# Patient Record
Sex: Male | Born: 1970 | Race: White | Hispanic: No | Smoking: Never smoker
Health system: Southern US, Community
[De-identification: ages and names within clinical notes are randomized; demographics above are authoritative.]

## PROBLEM LIST (undated history)

## (undated) DIAGNOSIS — L988 Other specified disorders of the skin and subcutaneous tissue: Secondary | ICD-10-CM

## (undated) DIAGNOSIS — K5792 Diverticulitis of intestine, part unspecified, without perforation or abscess without bleeding: Secondary | ICD-10-CM

## (undated) DIAGNOSIS — G4730 Sleep apnea, unspecified: Secondary | ICD-10-CM

## (undated) DIAGNOSIS — I2699 Other pulmonary embolism without acute cor pulmonale: Secondary | ICD-10-CM

## (undated) DIAGNOSIS — E669 Obesity, unspecified: Secondary | ICD-10-CM

## (undated) HISTORY — DX: Other specified disorders of the skin and subcutaneous tissue: L98.8

## (undated) HISTORY — DX: Other pulmonary embolism without acute cor pulmonale: I26.99

## (undated) HISTORY — DX: Diverticulitis of intestine, part unspecified, without perforation or abscess without bleeding: K57.92

## (undated) HISTORY — DX: Obesity, unspecified: E66.9

## (undated) HISTORY — PX: COLONOSCOPY: SHX174

## (undated) HISTORY — DX: Sleep apnea, unspecified: G47.30

---

## 2010-08-09 DIAGNOSIS — K5792 Diverticulitis of intestine, part unspecified, without perforation or abscess without bleeding: Secondary | ICD-10-CM

## 2010-08-09 HISTORY — DX: Diverticulitis of intestine, part unspecified, without perforation or abscess without bleeding: K57.92

## 2010-12-08 ENCOUNTER — Emergency Department (HOSPITAL_COMMUNITY): Payer: 59

## 2010-12-08 ENCOUNTER — Inpatient Hospital Stay (HOSPITAL_COMMUNITY)
Admission: EM | Admit: 2010-12-08 | Discharge: 2010-12-29 | DRG: 356 | Disposition: A | Discharge status: Home Health | Payer: 59 | Attending: General Surgery | Admitting: General Surgery

## 2010-12-08 DIAGNOSIS — K560 Paralytic ileus: Secondary | ICD-10-CM | POA: Diagnosis not present

## 2010-12-08 DIAGNOSIS — Z87442 Personal history of urinary calculi: Secondary | ICD-10-CM

## 2010-12-08 DIAGNOSIS — K630 Abscess of intestine: Secondary | ICD-10-CM | POA: Diagnosis present

## 2010-12-08 DIAGNOSIS — K5732 Diverticulitis of large intestine without perforation or abscess without bleeding: Principal | ICD-10-CM | POA: Diagnosis present

## 2010-12-08 DIAGNOSIS — Z6834 Body mass index (BMI) 34.0-34.9, adult: Secondary | ICD-10-CM

## 2010-12-08 DIAGNOSIS — K651 Peritoneal abscess: Secondary | ICD-10-CM | POA: Diagnosis present

## 2010-12-08 DIAGNOSIS — B3789 Other sites of candidiasis: Secondary | ICD-10-CM | POA: Diagnosis present

## 2010-12-08 DIAGNOSIS — R000 Tachycardia, unspecified: Secondary | ICD-10-CM | POA: Diagnosis present

## 2010-12-08 DIAGNOSIS — K658 Other peritonitis: Secondary | ICD-10-CM | POA: Diagnosis present

## 2010-12-08 DIAGNOSIS — G4730 Sleep apnea, unspecified: Secondary | ICD-10-CM | POA: Diagnosis present

## 2010-12-08 LAB — CBC
HCT: 38.2 % — ABNORMAL LOW (ref 39.0–52.0)
Hemoglobin: 13.4 g/dL (ref 13.0–17.0)
MCH: 29.6 pg (ref 26.0–34.0)
MCHC: 35.1 g/dL (ref 30.0–36.0)
MCV: 84.3 fL (ref 78.0–100.0)
Platelets: 433 10*3/uL — ABNORMAL HIGH (ref 150–400)
RBC: 4.53 MIL/uL (ref 4.22–5.81)
RDW: 13.3 % (ref 11.5–15.5)
WBC: 23 10*3/uL — ABNORMAL HIGH (ref 4.0–10.5)

## 2010-12-08 LAB — URINALYSIS, ROUTINE W REFLEX MICROSCOPIC
Glucose, UA: NEGATIVE mg/dL
Nitrite: NEGATIVE
Protein, ur: 100 mg/dL — AB
Specific Gravity, Urine: 1.031 — ABNORMAL HIGH (ref 1.005–1.030)
Urobilinogen, UA: 1 mg/dL (ref 0.0–1.0)
pH: 6 (ref 5.0–8.0)

## 2010-12-08 LAB — DIFFERENTIAL
Basophils Absolute: 0 10*3/uL (ref 0.0–0.1)
Basophils Relative: 0 % (ref 0–1)
Eosinophils Absolute: 0 10*3/uL (ref 0.0–0.7)
Eosinophils Relative: 0 % (ref 0–5)
Lymphocytes Relative: 4 % — ABNORMAL LOW (ref 12–46)
Lymphs Abs: 0.9 10*3/uL (ref 0.7–4.0)
Monocytes Absolute: 1.6 10*3/uL — ABNORMAL HIGH (ref 0.1–1.0)
Monocytes Relative: 7 % (ref 3–12)
Neutro Abs: 20.5 10*3/uL — ABNORMAL HIGH (ref 1.7–7.7)
Neutrophils Relative %: 89 % — ABNORMAL HIGH (ref 43–77)

## 2010-12-08 LAB — URINE MICROSCOPIC-ADD ON

## 2010-12-08 MED ORDER — IOHEXOL 300 MG/ML  SOLN: 100.0000 mL | Freq: Once | INTRAMUSCULAR | Status: AC | PRN

## 2010-12-08 MED ORDER — IOHEXOL 300 MG/ML  SOLN
100.0000 mL | Freq: Once | INTRAMUSCULAR | Status: AC | PRN
  Administered 2010-12-08: 100 mL via INTRAVENOUS

## 2010-12-09 LAB — COMPREHENSIVE METABOLIC PANEL
ALT: 50 U/L (ref 0–53)
AST: 24 U/L (ref 0–37)
Albumin: 2.6 g/dL — ABNORMAL LOW (ref 3.5–5.2)
Alkaline Phosphatase: 97 U/L (ref 39–117)
BUN: 9 mg/dL (ref 6–23)
CO2: 25 mEq/L (ref 19–32)
Calcium: 8.8 mg/dL (ref 8.4–10.5)
Chloride: 91 mEq/L — ABNORMAL LOW (ref 96–112)
Creatinine, Ser: 1.02 mg/dL (ref 0.4–1.5)
GFR calc Af Amer: >60 mL/min (ref >60)
GFR calc non Af Amer: >60 mL/min (ref >60)
Glucose, Bld: 108 mg/dL — ABNORMAL HIGH (ref 70–99)
Potassium: 4.5 mEq/L (ref 3.5–5.1)
Sodium: 129 mEq/L — ABNORMAL LOW (ref 135–145)
Total Bilirubin: 0.7 mg/dL (ref 0.3–1.2)
Total Protein: 6.5 g/dL (ref 6.0–8.3)

## 2010-12-09 LAB — CBC
HCT: 35.5 % — ABNORMAL LOW (ref 39.0–52.0)
Hemoglobin: 11.9 g/dL — ABNORMAL LOW (ref 13.0–17.0)
MCH: 29.1 pg (ref 26.0–34.0)
MCHC: 33.5 g/dL (ref 30.0–36.0)
MCV: 86.8 fL (ref 78.0–100.0)
Platelets: 465 10*3/uL — ABNORMAL HIGH (ref 150–400)
RBC: 4.09 MIL/uL — ABNORMAL LOW (ref 4.22–5.81)
RDW: 14.2 % (ref 11.5–15.5)
WBC: 22 10*3/uL — ABNORMAL HIGH (ref 4.0–10.5)

## 2010-12-09 LAB — CREATININE, SERUM
Creatinine, Ser: 0.96 mg/dL (ref 0.4–1.5)
GFR calc Af Amer: >60 mL/min (ref >60)
GFR calc non Af Amer: >60 mL/min (ref >60)

## 2010-12-09 LAB — HEMOGLOBIN A1C
Hgb A1c MFr Bld: 5.8 % — ABNORMAL HIGH (ref <5.7)
Mean Plasma Glucose: 120 mg/dL — ABNORMAL HIGH (ref <117)

## 2010-12-09 LAB — POTASSIUM: Potassium: 4.4 mEq/L (ref 3.5–5.1)

## 2010-12-09 LAB — GLUCOSE, CAPILLARY: Glucose-Capillary: 127 mg/dL — ABNORMAL HIGH (ref 70–99)

## 2010-12-10 HISTORY — PX: LAPAROSCOPY ABDOMEN DIAGNOSTIC: PRO50

## 2010-12-10 LAB — BASIC METABOLIC PANEL
BUN: 16 mg/dL (ref 6–23)
CO2: 26 mEq/L (ref 19–32)
Calcium: 9.4 mg/dL (ref 8.4–10.5)
Chloride: 93 mEq/L — ABNORMAL LOW (ref 96–112)
Creatinine, Ser: 0.93 mg/dL (ref 0.4–1.5)
GFR calc Af Amer: >60 mL/min (ref >60)
GFR calc non Af Amer: >60 mL/min (ref >60)
Glucose, Bld: 93 mg/dL (ref 70–99)
Potassium: 3.9 mEq/L (ref 3.5–5.1)
Sodium: 130 mEq/L — ABNORMAL LOW (ref 135–145)

## 2010-12-10 LAB — SURGICAL PCR SCREEN
MRSA, PCR: NEGATIVE
Staphylococcus aureus: NEGATIVE

## 2010-12-10 LAB — CBC
HCT: 35.6 % — ABNORMAL LOW (ref 39.0–52.0)
Hemoglobin: 11.9 g/dL — ABNORMAL LOW (ref 13.0–17.0)
MCH: 29.1 pg (ref 26.0–34.0)
MCHC: 33.4 g/dL (ref 30.0–36.0)
MCV: 87 fL (ref 78.0–100.0)
Platelets: 462 10*3/uL — ABNORMAL HIGH (ref 150–400)
RBC: 4.09 MIL/uL — ABNORMAL LOW (ref 4.22–5.81)
RDW: 14.3 % (ref 11.5–15.5)
WBC: 25.2 10*3/uL — ABNORMAL HIGH (ref 4.0–10.5)

## 2010-12-11 LAB — CREATININE, SERUM
Creatinine, Ser: 0.85 mg/dL (ref 0.4–1.5)
GFR calc Af Amer: >60 mL/min (ref >60)
GFR calc non Af Amer: >60 mL/min (ref >60)

## 2010-12-11 LAB — POCT I-STAT, CHEM 8
BUN: 7 mg/dL (ref 6–23)
Calcium, Ion: 1.06 mmol/L — ABNORMAL LOW (ref 1.12–1.32)
Chloride: 94 mEq/L — ABNORMAL LOW (ref 96–112)
Creatinine, Ser: 1.2 mg/dL (ref 0.4–1.5)
Glucose, Bld: 127 mg/dL — ABNORMAL HIGH (ref 70–99)
HCT: 45 % (ref 39.0–52.0)
Hemoglobin: 15.3 g/dL (ref 13.0–17.0)
Potassium: 3.4 mEq/L — ABNORMAL LOW (ref 3.5–5.1)
Sodium: 129 mEq/L — ABNORMAL LOW (ref 135–145)
TCO2: 24 mmol/L (ref 0–100)

## 2010-12-11 LAB — CBC
HCT: 33.9 % — ABNORMAL LOW (ref 39.0–52.0)
Hemoglobin: 11 g/dL — ABNORMAL LOW (ref 13.0–17.0)
MCH: 28.6 pg (ref 26.0–34.0)
MCHC: 32.4 g/dL (ref 30.0–36.0)
MCV: 88.3 fL (ref 78.0–100.0)
Platelets: 499 10*3/uL — ABNORMAL HIGH (ref 150–400)
RBC: 3.84 MIL/uL — ABNORMAL LOW (ref 4.22–5.81)
RDW: 14.9 % (ref 11.5–15.5)
WBC: 19.3 10*3/uL — ABNORMAL HIGH (ref 4.0–10.5)

## 2010-12-11 LAB — POTASSIUM: Potassium: 4.3 mEq/L (ref 3.5–5.1)

## 2010-12-12 LAB — CBC
HCT: 34.1 % — ABNORMAL LOW (ref 39.0–52.0)
Hemoglobin: 11.1 g/dL — ABNORMAL LOW (ref 13.0–17.0)
MCH: 28.8 pg (ref 26.0–34.0)
MCHC: 32.6 g/dL (ref 30.0–36.0)
MCV: 88.6 fL (ref 78.0–100.0)
Platelets: 584 K/uL — ABNORMAL HIGH (ref 150–400)
RBC: 3.85 MIL/uL — ABNORMAL LOW (ref 4.22–5.81)
RDW: 14.9 % (ref 11.5–15.5)
WBC: 19 K/uL — ABNORMAL HIGH (ref 4.0–10.5)

## 2010-12-12 LAB — BASIC METABOLIC PANEL WITH GFR
BUN: 12 mg/dL (ref 6–23)
CO2: 28 meq/L (ref 19–32)
Calcium: 8.4 mg/dL (ref 8.4–10.5)
Chloride: 99 meq/L (ref 96–112)
Creatinine, Ser: 0.66 mg/dL (ref 0.4–1.5)
GFR calc non Af Amer: >60 mL/min
Glucose, Bld: 81 mg/dL (ref 70–99)
Potassium: 3.5 meq/L (ref 3.5–5.1)
Sodium: 138 meq/L (ref 135–145)

## 2010-12-13 LAB — CBC
HCT: 32.5 % — ABNORMAL LOW (ref 39.0–52.0)
Hemoglobin: 10.6 g/dL — ABNORMAL LOW (ref 13.0–17.0)
MCH: 28.8 pg (ref 26.0–34.0)
MCHC: 32.6 g/dL (ref 30.0–36.0)
MCV: 88.3 fL (ref 78.0–100.0)
Platelets: 606 10*3/uL — ABNORMAL HIGH (ref 150–400)
RBC: 3.68 MIL/uL — ABNORMAL LOW (ref 4.22–5.81)
RDW: 14.9 % (ref 11.5–15.5)
WBC: 20 10*3/uL — ABNORMAL HIGH (ref 4.0–10.5)

## 2010-12-13 LAB — CULTURE, ROUTINE-ABSCESS

## 2010-12-13 LAB — CREATININE, SERUM
Creatinine, Ser: 0.91 mg/dL (ref 0.4–1.5)
GFR calc Af Amer: >60 mL/min (ref >60)
GFR calc non Af Amer: >60 mL/min (ref >60)

## 2010-12-13 LAB — POTASSIUM: Potassium: 4 mEq/L (ref 3.5–5.1)

## 2010-12-14 LAB — CBC
HCT: 34.1 % — ABNORMAL LOW (ref 39.0–52.0)
Hemoglobin: 11.2 g/dL — ABNORMAL LOW (ref 13.0–17.0)
MCH: 28.7 pg (ref 26.0–34.0)
MCHC: 32.8 g/dL (ref 30.0–36.0)
MCV: 87.4 fL (ref 78.0–100.0)
Platelets: 646 10*3/uL — ABNORMAL HIGH (ref 150–400)
RBC: 3.9 MIL/uL — ABNORMAL LOW (ref 4.22–5.81)
RDW: 14.8 % (ref 11.5–15.5)
WBC: 22.8 10*3/uL — ABNORMAL HIGH (ref 4.0–10.5)

## 2010-12-14 LAB — CREATININE, SERUM
Creatinine, Ser: 0.97 mg/dL (ref 0.4–1.5)
GFR calc Af Amer: >60 mL/min (ref >60)
GFR calc non Af Amer: >60 mL/min (ref >60)

## 2010-12-14 LAB — POTASSIUM: Potassium: 3.6 mEq/L (ref 3.5–5.1)

## 2010-12-15 LAB — BASIC METABOLIC PANEL
BUN: 6 mg/dL (ref 6–23)
CO2: 30 mEq/L (ref 19–32)
Calcium: 8.6 mg/dL (ref 8.4–10.5)
Chloride: 99 mEq/L (ref 96–112)
Creatinine, Ser: 1.13 mg/dL (ref 0.4–1.5)
GFR calc Af Amer: >60 mL/min (ref >60)
GFR calc non Af Amer: >60 mL/min (ref >60)
Glucose, Bld: 120 mg/dL — ABNORMAL HIGH (ref 70–99)
Potassium: 4.2 mEq/L (ref 3.5–5.1)
Sodium: 135 mEq/L (ref 135–145)

## 2010-12-15 LAB — CBC
HCT: 34.6 % — ABNORMAL LOW (ref 39.0–52.0)
Hemoglobin: 11.3 g/dL — ABNORMAL LOW (ref 13.0–17.0)
MCH: 28.7 pg (ref 26.0–34.0)
MCHC: 32.7 g/dL (ref 30.0–36.0)
MCV: 87.8 fL (ref 78.0–100.0)
Platelets: 611 10*3/uL — ABNORMAL HIGH (ref 150–400)
RBC: 3.94 MIL/uL — ABNORMAL LOW (ref 4.22–5.81)
RDW: 14.8 % (ref 11.5–15.5)
WBC: 21.3 10*3/uL — ABNORMAL HIGH (ref 4.0–10.5)

## 2010-12-15 LAB — ANAEROBIC CULTURE

## 2010-12-16 LAB — DIFFERENTIAL
Basophils Absolute: 0.1 10*3/uL (ref 0.0–0.1)
Basophils Relative: 0 % (ref 0–1)
Eosinophils Absolute: 0.3 10*3/uL (ref 0.0–0.7)
Eosinophils Relative: 2 % (ref 0–5)
Lymphocytes Relative: 12 % (ref 12–46)
Lymphs Abs: 2.1 10*3/uL (ref 0.7–4.0)
Monocytes Absolute: 1.3 10*3/uL — ABNORMAL HIGH (ref 0.1–1.0)
Monocytes Relative: 7 % (ref 3–12)
Neutro Abs: 14.1 10*3/uL — ABNORMAL HIGH (ref 1.7–7.7)
Neutrophils Relative %: 79 % — ABNORMAL HIGH (ref 43–77)

## 2010-12-16 LAB — CBC
HCT: 33.5 % — ABNORMAL LOW (ref 39.0–52.0)
Hemoglobin: 11 g/dL — ABNORMAL LOW (ref 13.0–17.0)
MCH: 28.9 pg (ref 26.0–34.0)
MCHC: 32.8 g/dL (ref 30.0–36.0)
MCV: 87.9 fL (ref 78.0–100.0)
Platelets: 648 10*3/uL — ABNORMAL HIGH (ref 150–400)
RBC: 3.81 MIL/uL — ABNORMAL LOW (ref 4.22–5.81)
RDW: 15 % (ref 11.5–15.5)
WBC: 17.9 10*3/uL — ABNORMAL HIGH (ref 4.0–10.5)

## 2010-12-17 LAB — CBC
HCT: 35.4 % — ABNORMAL LOW (ref 39.0–52.0)
Hemoglobin: 11.6 g/dL — ABNORMAL LOW (ref 13.0–17.0)
MCH: 29 pg (ref 26.0–34.0)
MCHC: 32.8 g/dL (ref 30.0–36.0)
MCV: 88.5 fL (ref 78.0–100.0)
Platelets: 694 10*3/uL — ABNORMAL HIGH (ref 150–400)
RBC: 4 MIL/uL — ABNORMAL LOW (ref 4.22–5.81)
RDW: 14.8 % (ref 11.5–15.5)
WBC: 24.2 10*3/uL — ABNORMAL HIGH (ref 4.0–10.5)

## 2010-12-18 ENCOUNTER — Inpatient Hospital Stay (HOSPITAL_COMMUNITY): Payer: 59

## 2010-12-18 LAB — BASIC METABOLIC PANEL
BUN: 11 mg/dL (ref 6–23)
CO2: 27 mEq/L (ref 19–32)
Calcium: 8.9 mg/dL (ref 8.4–10.5)
Chloride: 100 mEq/L (ref 96–112)
Creatinine, Ser: 1.13 mg/dL (ref 0.4–1.5)
GFR calc Af Amer: >60 mL/min (ref >60)
GFR calc non Af Amer: >60 mL/min (ref >60)
Glucose, Bld: 109 mg/dL — ABNORMAL HIGH (ref 70–99)
Potassium: 4.5 mEq/L (ref 3.5–5.1)
Sodium: 135 mEq/L (ref 135–145)

## 2010-12-18 LAB — DIFFERENTIAL
Basophils Absolute: 0.1 10*3/uL (ref 0.0–0.1)
Basophils Relative: 0 % (ref 0–1)
Eosinophils Absolute: 0.3 10*3/uL (ref 0.0–0.7)
Eosinophils Relative: 1 % (ref 0–5)
Lymphocytes Relative: 11 % — ABNORMAL LOW (ref 12–46)
Lymphs Abs: 2.6 10*3/uL (ref 0.7–4.0)
Monocytes Absolute: 1.7 10*3/uL — ABNORMAL HIGH (ref 0.1–1.0)
Monocytes Relative: 7 % (ref 3–12)
Neutro Abs: 18.6 10*3/uL — ABNORMAL HIGH (ref 1.7–7.7)
Neutrophils Relative %: 80 % — ABNORMAL HIGH (ref 43–77)

## 2010-12-18 LAB — CBC
HCT: 35.7 % — ABNORMAL LOW (ref 39.0–52.0)
Hemoglobin: 11.5 g/dL — ABNORMAL LOW (ref 13.0–17.0)
MCH: 28.4 pg (ref 26.0–34.0)
MCHC: 32.2 g/dL (ref 30.0–36.0)
MCV: 88.1 fL (ref 78.0–100.0)
Platelets: 739 10*3/uL — ABNORMAL HIGH (ref 150–400)
RBC: 4.05 MIL/uL — ABNORMAL LOW (ref 4.22–5.81)
RDW: 14.8 % (ref 11.5–15.5)
WBC: 23.1 10*3/uL — ABNORMAL HIGH (ref 4.0–10.5)

## 2010-12-18 MED ORDER — IOHEXOL 300 MG/ML  SOLN
100.0000 mL | Freq: Once | INTRAMUSCULAR | Status: AC | PRN
  Administered 2010-12-18: 100 mL via INTRAVENOUS

## 2010-12-19 LAB — CBC
HCT: 36.9 % — ABNORMAL LOW (ref 39.0–52.0)
Hemoglobin: 11.9 g/dL — ABNORMAL LOW (ref 13.0–17.0)
MCH: 28.5 pg (ref 26.0–34.0)
MCHC: 32.2 g/dL (ref 30.0–36.0)
MCV: 88.5 fL (ref 78.0–100.0)
Platelets: 867 10*3/uL — ABNORMAL HIGH (ref 150–400)
RBC: 4.17 MIL/uL — ABNORMAL LOW (ref 4.22–5.81)
RDW: 14.8 % (ref 11.5–15.5)
WBC: 25.7 10*3/uL — ABNORMAL HIGH (ref 4.0–10.5)

## 2010-12-19 LAB — DIFFERENTIAL
Basophils Absolute: 0 10*3/uL (ref 0.0–0.1)
Basophils Relative: 0 % (ref 0–1)
Eosinophils Absolute: 0.3 10*3/uL (ref 0.0–0.7)
Eosinophils Relative: 1 % (ref 0–5)
Lymphocytes Relative: 10 % — ABNORMAL LOW (ref 12–46)
Lymphs Abs: 2.6 10*3/uL (ref 0.7–4.0)
Monocytes Absolute: 1.8 10*3/uL — ABNORMAL HIGH (ref 0.1–1.0)
Monocytes Relative: 7 % (ref 3–12)
Neutro Abs: 21 10*3/uL — ABNORMAL HIGH (ref 1.7–7.7)
Neutrophils Relative %: 82 % — ABNORMAL HIGH (ref 43–77)

## 2010-12-20 LAB — DIFFERENTIAL
Basophils Absolute: 0 10*3/uL (ref 0.0–0.1)
Basophils Relative: 0 % (ref 0–1)
Eosinophils Absolute: 0.2 10*3/uL (ref 0.0–0.7)
Eosinophils Relative: 1 % (ref 0–5)
Lymphocytes Relative: 12 % (ref 12–46)
Lymphs Abs: 3 10*3/uL (ref 0.7–4.0)
Monocytes Absolute: 2.5 10*3/uL — ABNORMAL HIGH (ref 0.1–1.0)
Monocytes Relative: 10 % (ref 3–12)
Neutro Abs: 18.9 10*3/uL — ABNORMAL HIGH (ref 1.7–7.7)
Neutrophils Relative %: 77 % (ref 43–77)

## 2010-12-20 LAB — CBC
HCT: 37.2 % — ABNORMAL LOW (ref 39.0–52.0)
Hemoglobin: 12.4 g/dL — ABNORMAL LOW (ref 13.0–17.0)
MCH: 29.1 pg (ref 26.0–34.0)
MCHC: 33.3 g/dL (ref 30.0–36.0)
MCV: 87.3 fL (ref 78.0–100.0)
Platelets: 821 10*3/uL — ABNORMAL HIGH (ref 150–400)
RBC: 4.26 MIL/uL (ref 4.22–5.81)
RDW: 14.7 % (ref 11.5–15.5)
WBC: 24.6 10*3/uL — ABNORMAL HIGH (ref 4.0–10.5)

## 2010-12-21 LAB — DIFFERENTIAL
Basophils Absolute: 0.2 10*3/uL — ABNORMAL HIGH (ref 0.0–0.1)
Basophils Relative: 1 % (ref 0–1)
Eosinophils Absolute: 0.2 10*3/uL (ref 0.0–0.7)
Eosinophils Relative: 1 % (ref 0–5)
Lymphocytes Relative: 21 % (ref 12–46)
Lymphs Abs: 4.8 10*3/uL — ABNORMAL HIGH (ref 0.7–4.0)
Monocytes Absolute: 2.3 10*3/uL — ABNORMAL HIGH (ref 0.1–1.0)
Monocytes Relative: 10 % (ref 3–12)
Neutro Abs: 15.2 10*3/uL — ABNORMAL HIGH (ref 1.7–7.7)
Neutrophils Relative %: 67 % (ref 43–77)

## 2010-12-21 LAB — PATHOLOGIST SMEAR REVIEW

## 2010-12-21 LAB — CBC
HCT: 34.9 % — ABNORMAL LOW (ref 39.0–52.0)
Hemoglobin: 11.3 g/dL — ABNORMAL LOW (ref 13.0–17.0)
MCH: 28.5 pg (ref 26.0–34.0)
MCHC: 32.4 g/dL (ref 30.0–36.0)
MCV: 87.9 fL (ref 78.0–100.0)
Platelets: 936 10*3/uL (ref 150–400)
RBC: 3.97 MIL/uL — ABNORMAL LOW (ref 4.22–5.81)
RDW: 14.3 % (ref 11.5–15.5)
WBC: 22.7 10*3/uL — ABNORMAL HIGH (ref 4.0–10.5)

## 2010-12-22 LAB — CBC
HCT: 34.3 % — ABNORMAL LOW (ref 39.0–52.0)
Hemoglobin: 11 g/dL — ABNORMAL LOW (ref 13.0–17.0)
MCH: 28.1 pg (ref 26.0–34.0)
MCHC: 32.1 g/dL (ref 30.0–36.0)
MCV: 87.7 fL (ref 78.0–100.0)
Platelets: 1009 10*3/uL (ref 150–400)
RBC: 3.91 MIL/uL — ABNORMAL LOW (ref 4.22–5.81)
RDW: 14.3 % (ref 11.5–15.5)
WBC: 19 10*3/uL — ABNORMAL HIGH (ref 4.0–10.5)

## 2010-12-22 NOTE — Op Note (Signed)
NAMEATOM, SOLIVAN NO.:  [id number]  MEDICAL RECORD NO.:  [id number]           PATIENT TYPE:  I  LOCATION:  1403                         FACILITY:  [facility]  PHYSICIAN:  Ardeth Sportsman, MD     DATE OF BIRTH:  [birth year]  DATE OF PROCEDURE:  12/10/2010 DATE OF DISCHARGE:                              OPERATIVE REPORT   PRIMARY CARE PHYSICIAN:  Unavailable.  SURGEON:  Ardeth Sportsman, MD  ASSISTANT:  RN.  PREOPERATIVE DIAGNOSIS:  Perforated diverticulitis with failure of nonoperative management.  POSTOPERATIVE DIAGNOSES: 1. Perforated diverticulitis (Hinchey III class). 2. Small bowel interloop abscess. 3. Pelvic abscess.  PROCEDURE PERFORMED: 1. Diagnostic laparoscopy. 2. Laparoscopic drainage of interloop small bowel abscesses. 3. Drainage of pelvic abscess. 4. Abdominal washout with placement of pelvic drain.  ANESTHESIA: 1. General anesthesia. 2. Local anesthetic and a field block on all port sites.  SPECIMENS:  Pelvic abscess and aspiration for culture.  DRAINS:  A 19-French Blake drain goes from right lower quadrant towards the left pericolic gutter down into the pelvis.  ESTIMATED BLOOD LOSS:  Minimal.  COMPLICATIONS:  None.  INDICATION:  Mr. Rewerts is a 40 year old morbidly obese male, otherwise healthy, who developed abdominal pain 6 days ago.  He had increasing fevers and discomfort, and was admitted 36 hours ago with diagnosis of perforated diverticulitis.  He had some free fluid in the pelvis and air, but no specific loculated collections.  We admitted him and placed on IV antibiotics.  His white count decreased.  He defervesced.  His tachycardia improved with resuscitation.  However, his white count has increased today and he is having some low-grade fevers.  His abdominal pain is slightly worse, if not unchanged.  Based on the worsening white count and lack of improvement and lack of an obvious drainable collection  percutaneously, I recommended diagnostic laparoscopy, possibility of a hand assist or open expiration and possibility of sigmoid colectomy with ostomy was discussed as well.  The risks, benefits, and alternatives discussed.  Questions answered.  He and his sister, and his wife agreed to proceed.  OPERATIVE FINDINGS:  He had a moderate peritonitis infraumbilically.  He had some interloop loculations and phlegmon in the small bowel.  His sigmoid was rather inflamed, but I could not find a specific focal perforation.  In the pelvis, he had an abscess running along his entire rectum and the right side of the cul-de-sac.  I have called a Hinchey III class.  No feculent contamination.  DESCRIPTION OF PROCEDURE:  Informed consent was confirmed.  The patient was already on IV ertapenem or Invanz.  He underwent general anesthesia without any difficulty.  He had sequential compression devices during the entire case.  He was positioned in the low lithotomy with arms tucked.  Nasogastric tube was placed.  His abdomen and peritoneum were prepped and draped in sterile fashion.  A surgical time-out confirmed our plan.  I placed a #5-mm port in the right upper quadrant using optical entry technique with the patient steep Trendelenburg and right side up. Camera entry was clean.  Under direct visualization, I placed  a 5-mm port in the right lower quadrant in the epigastric and later in the left mid abdomen as well.  Inspection noted obvious inflammation and infraumbilical peritonitis. He pealed off his greater omentum off the small bowel and sigmoid colon, and reduced that up into left upper quadrant.  His small bowel was moderately dilated.  Around the small bowel, I broke up numerous central loop phlegmon and abscess pockets and eventually reduced all that off with a sigmoid colon sweep.  As I was freeing the colon, I got on the pelvis and released an obvious abscess cavity.  Because of that  purulent pocket, I went ahead and aspirated and sent some fluid off for culture using a closed suction trap system.  I did copious irrigation about 3 L.  I inspected bowel and saw no serosal difficulty and his sigmoid colon I evaluated and I could not find an obvious focal perforation of the definite inflammation and phlegmon.  I did another irrigation with another 3 L and reinspected and again could not find a focal perforation.  Therefore, I irrigated another final 3 L through the entire abdominal cavity and peeled off obvious phlegmon of the pelvis and in the anterior abdominal wall.  Because I could not find any definite focal perforation and I did not see feculent contamination, I felt it would be reasonable to give him a chance to go down with antibiotics and drain.  Therefore, I placed the drain as noted above.  I brought the greater omentum back down and allowed it to lay on top of this sigmoid anteriorly.  Of note, I could see the sigmoid mesentery and I could not see any loculation or pneumatosis or perforation implying a mesentery perforation.  The greater omentum then laid over the entire sigmoid colon and down into the pelvis anteriorly on the rectum quiet well.  I felt that functioned as somewhat of a patch.  I tried to keep small bowel away to avoid any possible fistulous formation.  There was clear return after the end of the irrigation, so I stopped at 9 L.  I did inspection and showed no injury.  Hemostasis was excellent.  I evacuated carbon dioxide through the ports.  I secured the drain using a nylon stitch.  Skin was closed with Monocryl.  Sterile dressing was applied.  We positioned him head up and evacuated another 500 mL out of the pelvis and there was clear return.  He was extubated and sent to the recovery room in the stable condition.  I discussed postoperative findings with the patient's family.  Of note, we will follow him closely if he has further  deterioration and he may require a segmental resection, but I am hopeful that we can calm this down and treat this nonoperatively for that way he can have a delayed endoscopy workup and possible elective resection at a later time given the fact that he is only 40.     Ardeth Sportsman, MD     SCG/MEDQ  D:  12/10/2010  T:  12/11/2010  Job:  [id number]  Electronically Signed by Karie Soda MD on 12/22/2010 12:26:55 PM

## 2010-12-22 NOTE — H&P (Signed)
NAMEJEFFERSON, Shawn Mosley NO.:  [id number]  MEDICAL RECORD NO.:  [id number]           PATIENT TYPE:  I  LOCATION:  1403                         FACILITY:  [facility]  PHYSICIAN:  Shawn Sportsman, MD     DATE OF BIRTH:  [birth year]  DATE OF ADMISSION:  12/08/2010 DATE OF DISCHARGE:                             HISTORY & PHYSICAL   REQUESTING PHYSICIAN:  Shawn Nielsen, MD, [facility].  PRIMARY CARE PHYSICIAN:  Not available.  SURGEON:  Shawn Sportsman, MD.  [facility].  REASON FOR EVALUATION:  Perforated diverticulitis.  HISTORY OF PRESENT ILLNESS:  Shawn Mosley is a 40 year old morbidly obese male, who has never had any health issues aside from kidney stones.  He has had a few mild attacks in the past, but he says these have resolved. He normally has a bowel movement about 2 or 3 times a day.  Five days ago, he did notice some intermittent abdominal pain.  It is primarily lower  and to the left.  It became a little more frequent.  No sick contacts or travel history.  No constipation or diarrhea.  His appetite was normal initially, but began to become weaker.  He started noticing some fevers and his sister noted temperature as high as 100 and 101.  Because the pain became persistent, his sister recommended he come in.  He normally can walk several miles as an activity.  He has no personal or family history of inflammatory bowel disease, Crohn, ulcerative colitis, irritable bowel syndrome.  He does have some mild heartburn, reflux to pepper and onions, but he avoids those foods, never takes any Tums or Rolaids.  No nausea or vomiting.  No hematochezia or melena.  No hematemesis.  PAST MEDICAL HISTORY:  Kidney stones, no recent attacks.  PAST SURGICAL HISTORY:  Negative.  MEDICATIONS:  Tylenol p.r.n.  ALLERGIES:  None known.  SOCIAL HISTORY:  No tobacco or drug use.  He occasionally has few drinks of alcohol.  He does stay with  his sister.  He is not married.  REVIEW OF SYSTEMS:  As noted per HPI.  GENERAL:  He has had some fevers and chills, question of night sweats.  His weight otherwise has been stable.  Eyes, ENT, cardiac negative, respiratory are otherwise negative.  GI:  As noted above.  No dysphagia.  GU:  No hematuria, pyuria, or dysuria.  He had  a little bit discomfort with urination, but that seems to have calmed down.  PHYSICAL EXAMINATION:  GENERAL:  He noted his pain actually backed off a little bit, but because of the fevers, these were concerning him. HEART:  Regular rate and rhythm.  It is tachycardiac.  I have counted a pulse at 110, it was as high as 130 when he came in.  He has gotten 1500 cc of crystalloid in. VASCULAR:  Normal radial and dorsalis pedis pulses.  No carotid bruits. ABDOMEN:  Obese, but soft.  He has some mild discomfort to palpation in his left lower quadrant, but only to deep palpation with no guarding. No real rebound tenderness.  No reproduction.  He has some mild discomfort to cough __________, but is certainly not severe and is less in right lower quadrant and suprapubically.  No umbilical hernia. GENITAL:  Normal external male genitalia.  Testes descended bilaterally. No inguinal hernias. RECTAL:  Deferred per the patient's request. SKIN:  No petechiae.  No purpura.  No sores or lesions. NEUROLOGIC:  Cranial nerves II through XII are intact.  Hand grip is 5/5 equal and symmetrical.  No resting or intention tremors. LYMPH:  No head, neck, axillary, or groin lymphadenopathy. PSYCH:  Pleasant, interactive.  No evidence of dementia, delirium, psychosis, or paranoia.  LABORATORY STUDIES:  He has a white count of 23,000.  Urinalysis:  Just some elevated leukocyte esterase.  He has a KUB that shows no bowel obstruction or free air.  He has a CAT scan that shows his sigmoid colon with significant inflammatory changes and moderate amount of dots of free air in his pelvis  tracking up his left gonadal vessels and a few dots area anterior to his liver as well. He does have some fluid run along the right lateral sigmoid in a fluid pocket that is not really loculated down the pelvis.  He has obvious diverticulosis.  There were no hernias or bowel obstruction.  No obvious masses or tumors.  ASSESSMENT AND PLAN:  A 40 year old morbidly obese male, otherwise healthy with perforated diverticulitis, with some tachycardia, improved with hydration, but clinically not as severe as his CAT scan is. 1. Admit. 2. IV fluids. 3. IV antibiotics. 4. Check hemoglobin A1c and electrolytes, make sure he does not have     underlying diabetes that is masking his  symptoms. 5. Telemetry. 6. Follow closely.  Because his tachycardia is improved and he looks calm and relaxed, and not severely uncomfortable, we will follow him closely and see if we can manage this nonoperatively.  If his pain markedly decreases, his  white count improves, he defervesces, and his tachycardia resolves, then I will do a followup CAT scan in the few days to see if he has a loculated fluid collection that would warrant a drain.  If he has persistent symptoms or worsening symptoms, then he may require exploration in the OR.  The anatomy and physiology of the digestive tract was explained.  The  pathophysiology of  diverticulitis with his different diagnosis discussed.  Discussion of need for diagnostic laparoscopy versus laparotomy with possible resection and colostomy was discussed.  The preference of tying to cool this down with antibiotics and/or drains to delay this to more an elective procedure later on down the line was discussed.  The patient wishes to hold off on surgery if he can, but agrees to being admitted.  His sister agrees as well.  Again , we will follow closely to see  how he feels.  I have a feeling that he has been perforated for several days and therefore, he has declared himself  from going into severe shock, but with the tachycardia guarded against, I want to watch him pretty closely.     Shawn Sportsman, MD     SCG/MEDQ  D:  12/08/2010  T:  12/09/2010  Job:  [id number]  Electronically Signed by Karie Soda MD on 12/22/2010 12:26:07 PM

## 2010-12-23 LAB — CBC
HCT: 34.9 % — ABNORMAL LOW (ref 39.0–52.0)
Hemoglobin: 11.3 g/dL — ABNORMAL LOW (ref 13.0–17.0)
MCH: 28.3 pg (ref 26.0–34.0)
MCHC: 32.4 g/dL (ref 30.0–36.0)
MCV: 87.3 fL (ref 78.0–100.0)
Platelets: 1055 10*3/uL (ref 150–400)
RBC: 4 MIL/uL — ABNORMAL LOW (ref 4.22–5.81)
RDW: 14.2 % (ref 11.5–15.5)
WBC: 19.5 10*3/uL — ABNORMAL HIGH (ref 4.0–10.5)

## 2010-12-24 ENCOUNTER — Inpatient Hospital Stay (HOSPITAL_COMMUNITY): Payer: 59

## 2010-12-24 ENCOUNTER — Encounter (HOSPITAL_COMMUNITY): Payer: Self-pay | Admitting: Radiology

## 2010-12-24 LAB — DIFFERENTIAL
Basophils Absolute: 0.1 10*3/uL (ref 0.0–0.1)
Basophils Relative: 0 % (ref 0–1)
Eosinophils Absolute: 0.2 10*3/uL (ref 0.0–0.7)
Eosinophils Relative: 1 % (ref 0–5)
Lymphocytes Relative: 13 % (ref 12–46)
Lymphs Abs: 2.7 10*3/uL (ref 0.7–4.0)
Monocytes Absolute: 1.6 10*3/uL — ABNORMAL HIGH (ref 0.1–1.0)
Monocytes Relative: 8 % (ref 3–12)
Neutro Abs: 16.6 10*3/uL — ABNORMAL HIGH (ref 1.7–7.7)
Neutrophils Relative %: 78 % — ABNORMAL HIGH (ref 43–77)

## 2010-12-24 LAB — CBC
HCT: 36.6 % — ABNORMAL LOW (ref 39.0–52.0)
Hemoglobin: 11.8 g/dL — ABNORMAL LOW (ref 13.0–17.0)
MCH: 28.4 pg (ref 26.0–34.0)
MCHC: 32.2 g/dL (ref 30.0–36.0)
MCV: 88 fL (ref 78.0–100.0)
Platelets: 1104 10*3/uL (ref 150–400)
RBC: 4.16 MIL/uL — ABNORMAL LOW (ref 4.22–5.81)
RDW: 14.3 % (ref 11.5–15.5)
WBC: 21.2 10*3/uL — ABNORMAL HIGH (ref 4.0–10.5)

## 2010-12-24 MED ORDER — IOHEXOL 300 MG/ML  SOLN
100.0000 mL | Freq: Once | INTRAMUSCULAR | Status: AC | PRN
  Administered 2010-12-24: 100 mL via INTRAVENOUS

## 2010-12-25 ENCOUNTER — Inpatient Hospital Stay (HOSPITAL_COMMUNITY): Payer: 59

## 2010-12-26 LAB — DIFFERENTIAL
Basophils Absolute: 0.1 10*3/uL (ref 0.0–0.1)
Basophils Relative: 0 % (ref 0–1)
Eosinophils Absolute: 0.3 10*3/uL (ref 0.0–0.7)
Eosinophils Relative: 2 % (ref 0–5)
Lymphocytes Relative: 16 % (ref 12–46)
Lymphs Abs: 2.5 10*3/uL (ref 0.7–4.0)
Monocytes Absolute: 1.9 10*3/uL — ABNORMAL HIGH (ref 0.1–1.0)
Monocytes Relative: 12 % (ref 3–12)
Neutro Abs: 11.1 10*3/uL — ABNORMAL HIGH (ref 1.7–7.7)
Neutrophils Relative %: 70 % (ref 43–77)

## 2010-12-26 LAB — CBC
HCT: 36.3 % — ABNORMAL LOW (ref 39.0–52.0)
Hemoglobin: 11.7 g/dL — ABNORMAL LOW (ref 13.0–17.0)
MCH: 28.4 pg (ref 26.0–34.0)
MCHC: 32.2 g/dL (ref 30.0–36.0)
MCV: 88.1 fL (ref 78.0–100.0)
Platelets: 960 10*3/uL (ref 150–400)
RBC: 4.12 MIL/uL — ABNORMAL LOW (ref 4.22–5.81)
RDW: 14.4 % (ref 11.5–15.5)
WBC: 15.8 10*3/uL — ABNORMAL HIGH (ref 4.0–10.5)

## 2010-12-27 LAB — CBC
HCT: 38 % — ABNORMAL LOW (ref 39.0–52.0)
Hemoglobin: 12.3 g/dL — ABNORMAL LOW (ref 13.0–17.0)
MCH: 28.4 pg (ref 26.0–34.0)
MCHC: 32.4 g/dL (ref 30.0–36.0)
MCV: 87.8 fL (ref 78.0–100.0)
Platelets: 912 10*3/uL (ref 150–400)
RBC: 4.33 MIL/uL (ref 4.22–5.81)
RDW: 14.2 % (ref 11.5–15.5)
WBC: 14 10*3/uL — ABNORMAL HIGH (ref 4.0–10.5)

## 2010-12-27 LAB — DIFFERENTIAL
Basophils Absolute: 0.1 10*3/uL (ref 0.0–0.1)
Basophils Relative: 0 % (ref 0–1)
Eosinophils Absolute: 0.3 10*3/uL (ref 0.0–0.7)
Eosinophils Relative: 2 % (ref 0–5)
Lymphocytes Relative: 19 % (ref 12–46)
Lymphs Abs: 2.7 10*3/uL (ref 0.7–4.0)
Monocytes Absolute: 1.6 10*3/uL — ABNORMAL HIGH (ref 0.1–1.0)
Monocytes Relative: 12 % (ref 3–12)
Neutro Abs: 9.3 10*3/uL — ABNORMAL HIGH (ref 1.7–7.7)
Neutrophils Relative %: 67 % (ref 43–77)

## 2010-12-28 LAB — DIFFERENTIAL
Basophils Absolute: 0.1 10*3/uL (ref 0.0–0.1)
Basophils Relative: 1 % (ref 0–1)
Eosinophils Absolute: 0.4 10*3/uL (ref 0.0–0.7)
Eosinophils Relative: 3 % (ref 0–5)
Lymphocytes Relative: 22 % (ref 12–46)
Lymphs Abs: 2.7 10*3/uL (ref 0.7–4.0)
Monocytes Absolute: 1.5 10*3/uL — ABNORMAL HIGH (ref 0.1–1.0)
Monocytes Relative: 12 % (ref 3–12)
Neutro Abs: 7.9 10*3/uL — ABNORMAL HIGH (ref 1.7–7.7)
Neutrophils Relative %: 63 % (ref 43–77)

## 2010-12-28 LAB — CBC
HCT: 37.8 % — ABNORMAL LOW (ref 39.0–52.0)
Hemoglobin: 12.3 g/dL — ABNORMAL LOW (ref 13.0–17.0)
MCH: 28.7 pg (ref 26.0–34.0)
MCHC: 32.5 g/dL (ref 30.0–36.0)
MCV: 88.1 fL (ref 78.0–100.0)
Platelets: 840 10*3/uL — ABNORMAL HIGH (ref 150–400)
RBC: 4.29 MIL/uL (ref 4.22–5.81)
RDW: 14.2 % (ref 11.5–15.5)
WBC: 12.6 10*3/uL — ABNORMAL HIGH (ref 4.0–10.5)

## 2010-12-29 LAB — BODY FLUID CULTURE

## 2010-12-30 LAB — ANAEROBIC CULTURE

## 2011-01-01 NOTE — Discharge Summary (Signed)
NAMEESTEVEN, OVERFELT NO.:  [id number]  MEDICAL RECORD NO.:  [id number]           PATIENT TYPE:  I  LOCATION:  1403                         FACILITY:  [facility]  PHYSICIAN:  Currie Paris, M.D.DATE OF BIRTH:  03-Sep-1970  DATE OF ADMISSION:  12/08/2010 DATE OF DISCHARGE:  12/29/2010                              DISCHARGE SUMMARY   ADMITTING PHYSICIAN:  Ardeth Sportsman, MD  DISCHARGING PHYSICIAN:  Currie Paris, M.D.  PRIMARY CARE PHYSICIAN:  None.  PROCEDURES: 1. Diagnostic laparoscopy with laparoscopic drainage of small bowel     abscesses, drainage of pelvic abscess and abdominal washout with     placement of pelvic drain by Dr. Karie Soda on Dec 10, 2010. 2. CT-guided abscess drainage by Interventional Radiology on Dec 25, 2010.  CONSULTANTS:  Interventional Radiology.  REASON FOR ADMISSION:  Mr. Morini is a 40 year old male who is otherwise healthy who 5 days prior to admission began having some intermittent abdominal pain that was predominantly in the lower abdomen on the left side.  It began to become more frequent and the patient was noted to have fevers as high as 101 at home.  Because of persistent pain, his sister recommended he come to the emergency room.  Upon arrival here, he had a KUB which revealed no bowel obstruction or free air.  He then had a CT scan which showed sigmoid colon with significant inflammatory changes and moderate amount of dots of free air in the pelvis tracking up his left gonadal vessels and a few dots of air inferior to the liver as well.  He also had some fluids along the right lateral sigmoid pocket that was not really loculated yet.  He did have obvious diverticulosis and all these findings were consistent with perforated diverticulitis. Please see admitting history and physical for further details.  ADMITTING DIAGNOSES: 1. Perforated diverticulitis. 2. Tachycardia.  HOSPITAL COURSE:  At this time,  the patient was admitted.  He was placed on IV antibiotics as well as IV fluids for hydration.  He was initially kept n.p.o. due to the significance of his diverticulitis.  The following day, the patient was rechecked.  His white blood cell count had decreased to 22,000 and his antibiotics were continued.  On hospital day #2, the patient's white blood cell count increased to 25,000.  He was continuing to have abdominal pain.  At this time, it was felt that due to a failure of medical management at this time, a diagnostic laparoscopy with abdominal washout would be pursued.  At this time, this was completed.  He had a drainage of a pelvic and interloop abscess during the procedure.  A pelvic drain was placed following the procedure.  Initially the patient did have an NG tube placed after surgery.  This was continued to await the resolution of postoperative ileus.  This continued until postoperative day #3 when his NG tube was discontinued and he was started on clear liquids.  His white count remained stable around 20,000 at this time.  Over the next several days, the patient seemed to be improving  despite minimal improvement in his white blood cell count.  However, on postoperative day #5, it did appear that the patient's surgical drain did have feculent material present. Despite this, his diet was advanced as tolerated as the patient's abdomen remained quite benign.  As the patient was becoming more stable for possible discharge home, he then began running fevers of 101 and above with an increase in white blood cell count up to 25,000. At this time, the patient was kept and a repeat CT scan was completed. His diet was also backed off to clear liquids.  The CT scan showed an increase in abscess in the pelvis.  At this point, it was felt that we needed to increase the irrigation to drain to see if that would assist with further evacuation of abscess.  Once again over the next several days  his diet was advanced back up as tolerated.  His white blood cell count did begin to trend down.  A repeat CT scan was obtained on Dec 25, 2010, which is about a week status post his last CT scan.  Once this was completed, it did show fluid collection in the pelvis and a question of whether the actual pelvic drain was working or not.  Therefore, Interventional Radiology was consulted and a new pelvic drain was placed.  After this drain was placed, the patient's fairly stable white count around 20,000 began to decrease and eventually several days after the drain was placed was around 12,000.  The patient remained stable, tolerating a regular diet with minimal abdominal tenderness.  His original surgical drain at this point was draining very minimal and was no longer feculent.  The new Interventional Radiology JP drain was draining some cloudy serous output.  By Dec 29, 2010, the patient was felt to be stable for discharge home.  Of note, the new pelvic drain that was placed by Interventional Radiology did have cultures.  These cultures revealed some yeast and therefore the patient in addition to Augmentin and Flagyl was placed on Diflucan.  Home health was arranged for drain care.  DISCHARGE DIAGNOSES: 1. Perforated diverticulitis status post diagnostic laparoscopic     abdominal washout with drainage of pelvic and interloop abscesses. 2. Status post percutaneous abdominal abscess drainage. 3. Leukocytosis which is resolving.  DISCHARGE MEDICATIONS:  Please see medication reconciliation form.  DISCHARGE INSTRUCTIONS:  The patient may increase his activity slowly. He may shower, however, he is not to bathe.  He is to resume a low-fiber diet.  He is to flush his drains with 10 cc of saline 3 times a day.  He will have home health arranged to help with this initially.  He is to follow up with Dr. Michaell Cowing in our office in 1 week.  He is to call for worsening pain or fever greater than  101.5.     Letha Cape, PA   ______________________________ Currie Paris, M.D.    KEO/MEDQ  D:  12/29/2010  T:  12/29/2010  Job:  [id number]  cc:   Ardeth Sportsman, MD [address] [place] [address]  Electronically Signed by Barnetta Chapel PA on 12/31/2010 01:35:14 PM Electronically Signed by Cyndia Bent M.D. on 01/01/2011 11:32:06 AM

## 2011-01-06 ENCOUNTER — Encounter: Payer: Self-pay | Admitting: Gastroenterology

## 2011-01-06 ENCOUNTER — Other Ambulatory Visit (INDEPENDENT_AMBULATORY_CARE_PROVIDER_SITE_OTHER): Payer: Self-pay | Admitting: Surgery

## 2011-01-06 DIAGNOSIS — IMO0001 Reserved for inherently not codable concepts without codable children: Secondary | ICD-10-CM

## 2011-01-08 ENCOUNTER — Encounter: Payer: Self-pay | Admitting: Gastroenterology

## 2011-01-08 ENCOUNTER — Ambulatory Visit (INDEPENDENT_AMBULATORY_CARE_PROVIDER_SITE_OTHER): Payer: 59 | Admitting: Gastroenterology

## 2011-01-08 DIAGNOSIS — K5730 Diverticulosis of large intestine without perforation or abscess without bleeding: Secondary | ICD-10-CM

## 2011-01-08 DIAGNOSIS — R933 Abnormal findings on diagnostic imaging of other parts of digestive tract: Secondary | ICD-10-CM

## 2011-01-08 NOTE — Progress Notes (Signed)
HPI: This is a  very pleasant 40 year old man who was admitted to [facility] early May and spent 3 weeks there with complicated diverticulitis.  He was managed at first on IV antibiotics but eventually required laparoscopy with washout and drain placement. He improved but developed a delayed abscess and had a percutaneous drain placed mid May. That drain remains in place, he has been on oral antibiotics since discharge about 10 days ago. He has had no fevers or chills and no abdominal pain. The drain which exits his right buttocks continues to drain murky fluid. He is scheduled for a drain check, fistulogram one week from now. He is here to consider colonoscopy prior to eventual sigmoid colectomy.   Review of systems: Pertinent positive and negative review of systems were noted in the above HPI section.  All other review of systems was otherwise negative.   Past Medical History, Past Surgical History, Family History, Social History, Current Medications, Allergies were all reviewed with the patient via [facility] electronic medical record system.   Physical Exam: Ht 5\' 9"  (1.753 m)  Wt 204 lb (92.534 kg)  BMI 30.13 kg/m2 Constitutional: generally well-appearing Psychiatric: alert and oriented x3 Eyes: extraocular movements intact Mouth: oral pharynx moist, no lesions Neck: supple no lymphadenopathy Cardiovascular: heart regular rate and rhythm Lungs: clear to auscultation bilaterally Abdomen: soft, nontender, nondistended, no obvious ascites, no peritoneal signs, normal bowel sounds Extremities: no lower extremity edema bilaterally Skin: no lesions on visible extremities    Assessment and plan: 40 y.o. male with recent complicated diverticulitis, still has percutaneous abscess drain in place  He should have colonoscopy prior to his sigmoid colectomy which is currently scheduled for early August. Dr. Michaell Cowing feels and I agree that this should not be done until he at least have a  very good idea that the abscess has healed and the drain is safely pulled.  We will tentatively schedule him for mid July colonoscopy and he will call in 4 weeks to report on his progress.

## 2011-01-08 NOTE — Patient Instructions (Signed)
You will be set up for a colonoscopy in Mid July at [facility], this should give time for your fistula to heal, drain to be removed. Call Dr. Christella Hartigan office in 4 weeks to update on how you are doing, specifically is the drain out and healed over. A copy of this information will be made available to Dr. Michaell Cowing.

## 2011-01-14 ENCOUNTER — Ambulatory Visit (HOSPITAL_COMMUNITY)
Admission: RE | Admit: 2011-01-14 | Discharge: 2011-01-14 | Disposition: A | Discharge status: Home / Self Care | Payer: 59 | Source: Ambulatory Visit | Attending: Surgery | Admitting: Surgery

## 2011-01-14 ENCOUNTER — Other Ambulatory Visit (INDEPENDENT_AMBULATORY_CARE_PROVIDER_SITE_OTHER): Payer: Self-pay | Admitting: Surgery

## 2011-01-14 ENCOUNTER — Other Ambulatory Visit (HOSPITAL_COMMUNITY): Payer: 59

## 2011-01-14 DIAGNOSIS — K5730 Diverticulosis of large intestine without perforation or abscess without bleeding: Secondary | ICD-10-CM | POA: Insufficient documentation

## 2011-01-14 DIAGNOSIS — IMO0001 Reserved for inherently not codable concepts without codable children: Secondary | ICD-10-CM

## 2011-01-14 MED ORDER — IOHEXOL 300 MG/ML  SOLN
35.0000 mL | Freq: Once | INTRAMUSCULAR | Status: AC | PRN
  Administered 2011-01-14: 35 mL

## 2011-02-02 ENCOUNTER — Ambulatory Visit (INDEPENDENT_AMBULATORY_CARE_PROVIDER_SITE_OTHER): Payer: 59 | Admitting: Surgery

## 2011-02-02 ENCOUNTER — Encounter (INDEPENDENT_AMBULATORY_CARE_PROVIDER_SITE_OTHER): Payer: Self-pay | Admitting: Surgery

## 2011-02-02 ENCOUNTER — Other Ambulatory Visit (INDEPENDENT_AMBULATORY_CARE_PROVIDER_SITE_OTHER): Payer: Self-pay | Admitting: Surgery

## 2011-02-02 DIAGNOSIS — K5732 Diverticulitis of large intestine without perforation or abscess without bleeding: Secondary | ICD-10-CM

## 2011-02-02 DIAGNOSIS — K5720 Diverticulitis of large intestine with perforation and abscess without bleeding: Secondary | ICD-10-CM | POA: Insufficient documentation

## 2011-02-02 DIAGNOSIS — K5792 Diverticulitis of intestine, part unspecified, without perforation or abscess without bleeding: Secondary | ICD-10-CM

## 2011-02-02 NOTE — Progress Notes (Signed)
Subjective:     Patient ID: Shawn Mosley, male   DOB: 1971-03-19, 40 y.o.   MRN: [id number]    There were no vitals taken for this visit.    HPI  40 year old male with diverticulitis complicated by abscess & recurrent abscess with Candida .  Fistula status post drain. He is feeling better. He has lost 40 pounds all this happened. He is feeling stressed with the recent horse and he prolonged sickness. He's had 3 bowel movements a day.  They are flushing the drain with 10 mL site twice a day. They're noting that its leaking around the drain and coming back.  The output is slighlty feculent or bilious in the morning but then clears up into the day. He has had not had any attacks. He's been staying on a bland diet.  Review of Systems  Gastrointestinal: Negative for nausea, vomiting, abdominal pain, diarrhea, constipation, blood in stool, abdominal distention, anal bleeding and rectal pain.  Psychiatric/Behavioral: Positive for sleep disturbance and dysphoric mood.       Stressed w divorce & long hospital stay  All other systems reviewed and are negative.       Objective:   Physical Exam  Constitutional: He appears well-developed and well-nourished. No distress.  HENT:  Head: Normocephalic.  Mouth/Throat: Oropharynx is clear and moist.  Cardiovascular: Normal rate and intact distal pulses.   Pulmonary/Chest: Effort normal. No respiratory distress.  Abdominal: Soft. Bowel sounds are normal. He exhibits no distension. There is no tenderness. There is no guarding.  Musculoskeletal: Normal range of motion.  Neurological: He is alert. No cranial nerve deficit.  Skin: Skin is warm and dry. He is not diaphoretic.  Psychiatric:       Tearful but consolable       Assessment:     Diverticulitis in a morbidly obese male complicated by recurrent abscess with chronic fistula, now controlled with antibiotics and drain.    Plan:     Drain study. If a fistula if the tissue is closed &  abscesses gone, then remove the drain. If it persists then prepped and eventually need to remove or readjust it.  Once the drain is out, do colonoscopy, then surgery. I agree with Dr. Christella Hartigan who should wait to the fissures closed scissors were risk of perforation or inflammation.  Left-assisted segmental colonic resection. And possibility open. He's lost weight which should help decrease his risk of infection other problems. Discussed procedure and person benefits. Were doing this way to lead to a one stage procedure. He is very motivated to minimize the chance of ostomy but understands that it may be needed to help her prevent a life-threatening event.  Continue fluconazole as last abscess grew out Candida only. Reserve Augmentin and Flagyl as p.r.n. only. I did give him a prescription and told him to fill that if he feels another attack coming on.  His surgery date is today for early August. We'll keep that for now. It may need to be delayed depending on when the fistula closes. Maybe we proceed with surgery if fistula persists, but wait for least 3 months of persistent fistula to allow inflammation to minimize.

## 2011-02-04 ENCOUNTER — Ambulatory Visit (HOSPITAL_COMMUNITY)
Admission: RE | Admit: 2011-02-04 | Discharge: 2011-02-04 | Disposition: A | Discharge status: Home / Self Care | Payer: 59 | Source: Ambulatory Visit | Attending: Surgery | Admitting: Surgery

## 2011-02-04 DIAGNOSIS — K5720 Diverticulitis of large intestine with perforation and abscess without bleeding: Secondary | ICD-10-CM

## 2011-02-04 DIAGNOSIS — K5732 Diverticulitis of large intestine without perforation or abscess without bleeding: Secondary | ICD-10-CM | POA: Insufficient documentation

## 2011-02-04 MED ORDER — IOHEXOL 300 MG/ML  SOLN
50.0000 mL | Freq: Once | INTRAMUSCULAR | Status: AC | PRN
  Administered 2011-02-04: 26 mL

## 2011-02-09 ENCOUNTER — Telehealth (INDEPENDENT_AMBULATORY_CARE_PROVIDER_SITE_OTHER): Payer: Self-pay

## 2011-02-09 NOTE — Telephone Encounter (Signed)
Returned [employer]. Pt c/o calf pain that started yesterday but is better today. Pt went to see P.A. In [facility]. And she told him to take ibuprofen which has helped the pain. Pt advised to keep watch over area if gets worse to go to ER per Dr Michaell Cowing [facility] 02-09-11

## 2011-02-16 ENCOUNTER — Encounter (INDEPENDENT_AMBULATORY_CARE_PROVIDER_SITE_OTHER): Payer: Self-pay | Admitting: Surgery

## 2011-02-17 ENCOUNTER — Ambulatory Visit (INDEPENDENT_AMBULATORY_CARE_PROVIDER_SITE_OTHER): Payer: 59 | Admitting: Surgery

## 2011-02-17 ENCOUNTER — Encounter (INDEPENDENT_AMBULATORY_CARE_PROVIDER_SITE_OTHER): Payer: Self-pay | Admitting: Surgery

## 2011-02-17 VITALS — BP 122/80 | HR 68 | Temp 96.6°F | Ht 69.0 in | Wt 196.0 lb

## 2011-02-17 DIAGNOSIS — M79669 Pain in unspecified lower leg: Secondary | ICD-10-CM

## 2011-02-17 DIAGNOSIS — K5732 Diverticulitis of large intestine without perforation or abscess without bleeding: Secondary | ICD-10-CM

## 2011-02-17 DIAGNOSIS — M7989 Other specified soft tissue disorders: Secondary | ICD-10-CM

## 2011-02-17 DIAGNOSIS — K5720 Diverticulitis of large intestine with perforation and abscess without bleeding: Secondary | ICD-10-CM

## 2011-02-17 DIAGNOSIS — M79609 Pain in unspecified limb: Secondary | ICD-10-CM

## 2011-02-17 NOTE — Progress Notes (Signed)
Subjective:     Patient ID: Shawn Mosley, male   DOB: 10-12-1970, 40 y.o.   MRN: [id number]    BP 122/80  Pulse 68  Temp(Src) 96.6 F (35.9 C) (Temporal)  Ht 5\' 9"  (1.753 m)  Wt 196 lb (88.905 kg)  BMI 28.94 kg/m2    HPI  Diagnoses:  Diverticulitis with chronic fistula. Right calf pain and swelling.  The patient comes in today feeling okay. No nausea vomiting. He's having a 2 bowel movements a day. No bleeding.No worsening pain. His fevers chills or sweats.  The drainage output is down to 5-10 mL a day.  Tan with occasionally some flecks of pink/red.  Leaks around skin with flushing.  He began to get leg swelling last week. Started out with right calf pain. The swelling has been below the knee. It has gone down. He saw a PA. They recommended followup. He did not have any studies. He called [place], and we recommend starting aspirin & calling if it got worse. He is walking better today. The swelling is down but has not gone away.  Review of Systems  Constitutional: Negative for fever, chills and diaphoresis.  HENT: Negative for nosebleeds, sore throat, facial swelling, mouth sores, trouble swallowing and ear discharge.   Eyes: Negative for photophobia, discharge and visual disturbance.  Respiratory: Negative for choking, chest tightness, shortness of breath and stridor.   Cardiovascular: Negative for chest pain and palpitations.  Gastrointestinal: Negative for nausea, vomiting, abdominal pain, diarrhea, constipation, blood in stool, abdominal distention, anal bleeding and rectal pain.  Genitourinary: Negative for dysuria, urgency, difficulty urinating and testicular pain.  Musculoskeletal: Negative for myalgias, back pain, arthralgias and gait problem.  Skin: Negative for color change, pallor, rash and wound.  Neurological: Negative for dizziness, speech difficulty, weakness, numbness and headaches.  Hematological: Negative for adenopathy. Does not bruise/bleed easily.    Psychiatric/Behavioral: Negative for hallucinations, confusion and agitation.       Objective:   Physical Exam  Constitutional: He is oriented to person, place, and time. He appears well-developed and well-nourished. No distress.  HENT:  Head: Normocephalic.  Mouth/Throat: Oropharynx is clear and moist. No oropharyngeal exudate.  Eyes: Conjunctivae and EOM are normal. Pupils are equal, round, and reactive to light. No scleral icterus.  Neck: Normal range of motion. Neck supple. No tracheal deviation present.  Cardiovascular: Normal rate, regular rhythm and intact distal pulses.   Pulmonary/Chest: Effort normal and breath sounds normal. No respiratory distress.  Abdominal: Soft. He exhibits no distension and no mass. There is no tenderness. There is no rebound. Hernia confirmed negative in the right inguinal area and confirmed negative in the left inguinal area.       Less obese.  RLQ drain with scant tan creamy fluid in drain.  Exit site clean  Musculoskeletal: Normal range of motion. He exhibits edema and tenderness.       Right calf swelling mild.  2+ edema/ankle.  Lymphadenopathy:    He has no cervical adenopathy.       Right: No inguinal adenopathy present.       Left: No inguinal adenopathy present.  Neurological: He is alert and oriented to person, place, and time. No cranial nerve deficit. He exhibits normal muscle tone. Coordination normal.  Skin: Skin is warm and dry. No rash noted. He is not diaphoretic. No erythema. No pallor.  Psychiatric: He has a normal mood and affect. His behavior is normal. Judgment and thought content normal.  Assessment:     Diverticulitis with fistula. A abscess cavity closing down.  Right calf swelling and edema. Rule out DVT.    Plan:     Drains studies last fistulogram later this month. Delayed surgery until fistula closed.  Colonoscopy once this was closed just prior to surgery.  Duplex of bilateral extremities to rule out DVT.  Continue aspirin.  Status primary care physician. He is working on this.  Call after studies are done.  Overall he looks quite well. We do need to do resection at some point. If the fistula remains open more than into the fall then we may have to by the bullet and is to surgery anyway. However but there was a bit longer it would make the likelihood of perioperative ostomy less. He is motivated to wait to avoid that.

## 2011-02-17 NOTE — Patient Instructions (Signed)
Complete fluconazole antibiotic.  Get drain study & ultrasound (duplex) of legs to rule out leg clot.  Call after tests done.  Surgery in August may have to be delayed until the fistula is closed

## 2011-02-18 ENCOUNTER — Emergency Department (HOSPITAL_COMMUNITY)
Admission: EM | Admit: 2011-02-18 | Discharge: 2011-02-18 | Disposition: A | Discharge status: Home / Self Care | Payer: 59 | Attending: Emergency Medicine | Admitting: Emergency Medicine

## 2011-02-18 ENCOUNTER — Ambulatory Visit
Admission: RE | Admit: 2011-02-18 | Discharge: 2011-02-18 | Disposition: A | Discharge status: Home / Self Care | Payer: 59 | Source: Ambulatory Visit | Attending: Surgery | Admitting: Surgery

## 2011-02-18 ENCOUNTER — Other Ambulatory Visit (INDEPENDENT_AMBULATORY_CARE_PROVIDER_SITE_OTHER): Payer: Self-pay | Admitting: Surgery

## 2011-02-18 DIAGNOSIS — M7989 Other specified soft tissue disorders: Secondary | ICD-10-CM

## 2011-02-18 DIAGNOSIS — I82409 Acute embolism and thrombosis of unspecified deep veins of unspecified lower extremity: Secondary | ICD-10-CM | POA: Insufficient documentation

## 2011-02-18 DIAGNOSIS — M79669 Pain in unspecified lower leg: Secondary | ICD-10-CM

## 2011-02-18 LAB — CBC
HCT: 36.5 % — ABNORMAL LOW (ref 39.0–52.0)
Hemoglobin: 11.5 g/dL — ABNORMAL LOW (ref 13.0–17.0)
MCH: 27.8 pg (ref 26.0–34.0)
MCHC: 31.5 g/dL (ref 30.0–36.0)
MCV: 88.4 fL (ref 78.0–100.0)
Platelets: 459 10*3/uL — ABNORMAL HIGH (ref 150–400)
RBC: 4.13 MIL/uL — ABNORMAL LOW (ref 4.22–5.81)
RDW: 14.1 % (ref 11.5–15.5)
WBC: 11.4 10*3/uL — ABNORMAL HIGH (ref 4.0–10.5)

## 2011-02-18 LAB — BASIC METABOLIC PANEL
BUN: 10 mg/dL (ref 6–23)
CO2: 29 mEq/L (ref 19–32)
Calcium: 10.1 mg/dL (ref 8.4–10.5)
Chloride: 100 mEq/L (ref 96–112)
Creatinine, Ser: 0.81 mg/dL (ref 0.50–1.35)
GFR calc Af Amer: >60 mL/min (ref >60)
GFR calc non Af Amer: >60 mL/min (ref >60)
Glucose, Bld: 94 mg/dL (ref 70–99)
Potassium: 3.9 mEq/L (ref 3.5–5.1)
Sodium: 137 mEq/L (ref 135–145)

## 2011-02-18 LAB — PROTIME-INR
INR: 1.03 (ref 0.00–1.49)
Prothrombin Time: 13.7 seconds (ref 11.6–15.2)

## 2011-02-18 LAB — APTT: aPTT: 30 seconds (ref 24–37)

## 2011-03-03 ENCOUNTER — Ambulatory Visit
Admission: RE | Admit: 2011-03-03 | Discharge: 2011-03-03 | Disposition: A | Discharge status: Home / Self Care | Payer: 59 | Source: Ambulatory Visit | Attending: Surgery | Admitting: Surgery

## 2011-03-03 DIAGNOSIS — K5720 Diverticulitis of large intestine with perforation and abscess without bleeding: Secondary | ICD-10-CM

## 2011-03-04 ENCOUNTER — Telehealth (INDEPENDENT_AMBULATORY_CARE_PROVIDER_SITE_OTHER): Payer: Self-pay

## 2011-03-04 DIAGNOSIS — K5792 Diverticulitis of intestine, part unspecified, without perforation or abscess without bleeding: Secondary | ICD-10-CM

## 2011-03-04 NOTE — Telephone Encounter (Signed)
Tammy from [facility] called req, for Dr Michaell Cowing to put another Drain Study in for pt to get next wk.Hulda Humphrey

## 2011-03-10 ENCOUNTER — Ambulatory Visit
Admission: RE | Admit: 2011-03-10 | Discharge: 2011-03-10 | Disposition: A | Discharge status: Home / Self Care | Payer: 59 | Source: Ambulatory Visit | Attending: Surgery | Admitting: Surgery

## 2011-03-10 DIAGNOSIS — K5792 Diverticulitis of intestine, part unspecified, without perforation or abscess without bleeding: Secondary | ICD-10-CM

## 2011-03-16 ENCOUNTER — Inpatient Hospital Stay (HOSPITAL_COMMUNITY): Admission: RE | Admit: 2011-03-16 | Payer: 59 | Source: Ambulatory Visit | Admitting: Surgery

## 2012-01-27 ENCOUNTER — Encounter: Payer: Self-pay | Admitting: Physician Assistant

## 2012-01-27 LAB — PROTIME-INR: INR: 2.4 — AB (ref 0.9–1.1)

## 2012-02-17 ENCOUNTER — Encounter: Payer: Self-pay | Admitting: Physician Assistant

## 2012-08-08 IMAGING — CR DG ABDOMEN 1V
2 series · 2 of 2 positions shown · non-contrast
Comparison: None.

CLINICAL DATA: 40-year-old male abdominal pain.

ABDOMEN - 1 VIEW

[t abdomen supine (1 of 2)]
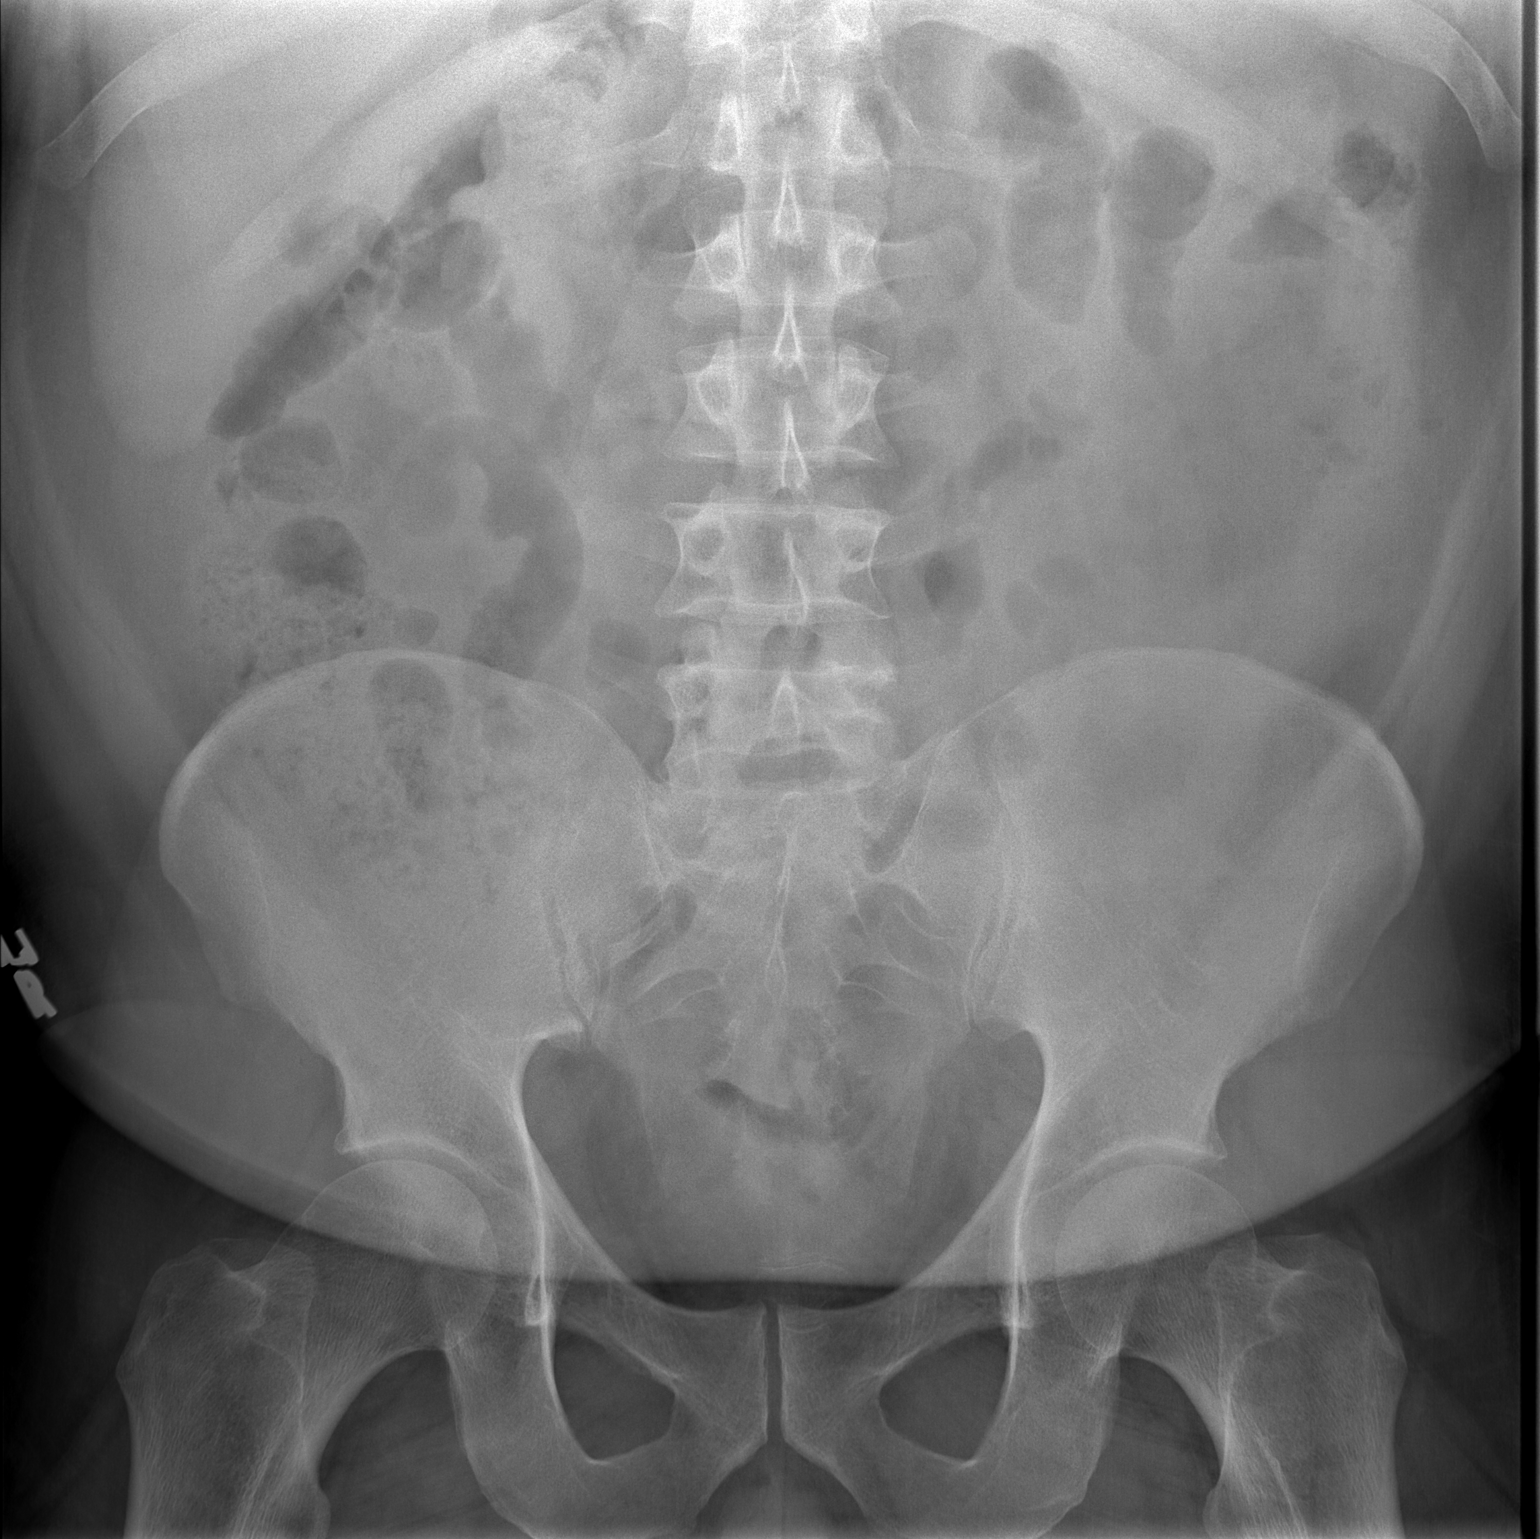

[t abdomen supine (2 of 2)]
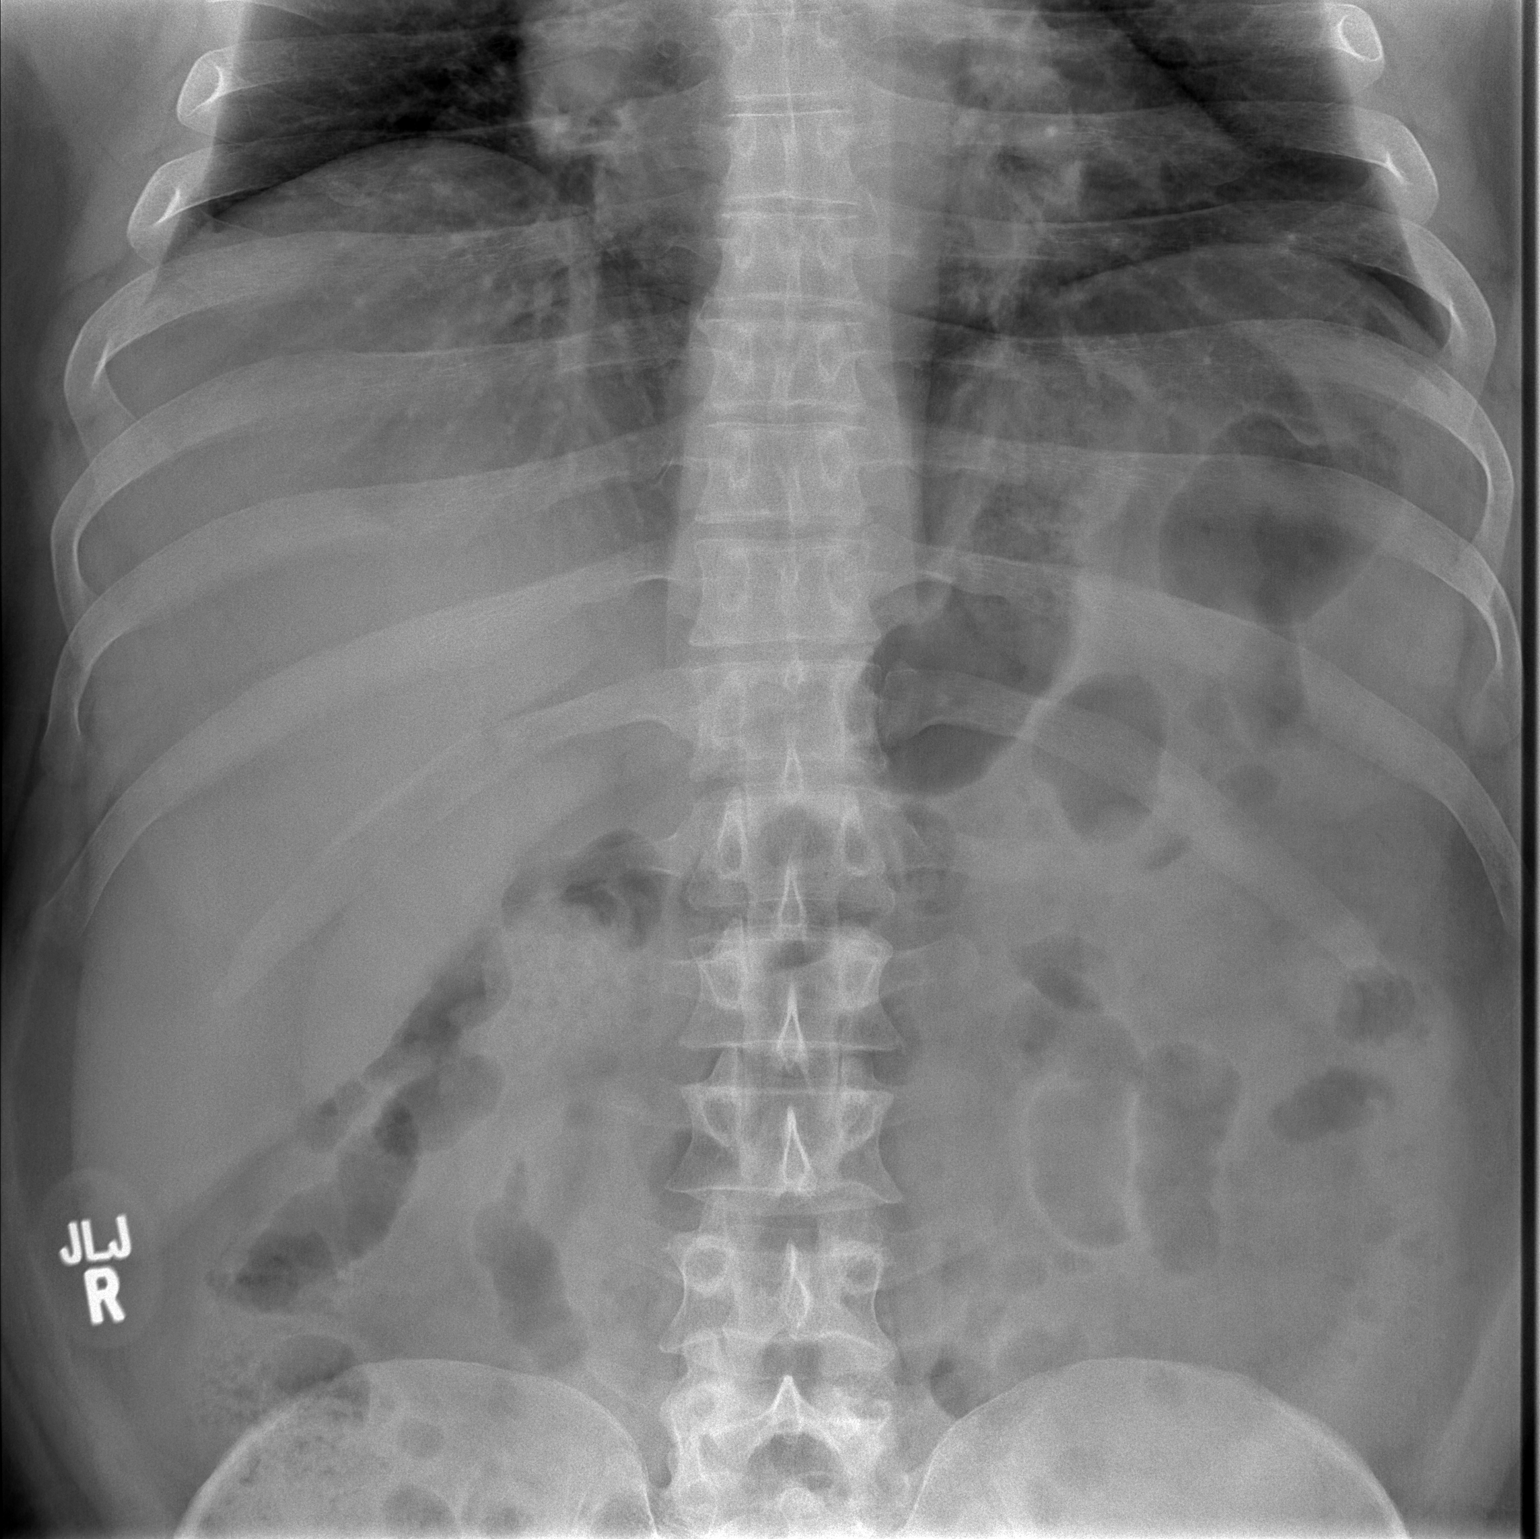

[2 of 2 positions shown; findings below may reference images not displayed]

FINDINGS: 2 supine views of the abdomen.  Sub segmental atelectasis
suspected at the lung bases.  No definite pneumoperitoneum, no
upright view provided. Nonobstructed bowel gas pattern.  Abdominal
and pelvic visceral contours are within normal limits. No acute
osseous abnormality identified.
IMPRESSION: Nonobstructed bowel gas pattern, no free air.  Lung base
atelectasis.

## 2012-08-18 IMAGING — CT CT ABD-PELV W/ CM
2 of 5 series · 16 of 46 positions shown, 18 images · IV contrast (agent unspecified)
Comparison: CT abdomen pelvis 12/08/2010

CLINICAL DATA: Evaluate diverticular abscess.

CT ABDOMEN AND PELVIS WITH CONTRAST
TECHNIQUE: Multidetector CT imaging of the abdomen and pelvis was
performed following the standard protocol during bolus
administration of intravenous contrast.
Contrast: 100 ml Zmnipaque-NMM.

[Series 2: rtn a/p with · axial · 0.88mm/px · z∈[-464,-14]mm · 13 of 102 slices shown, 15 images]
[im 6/102  soft-tissue]
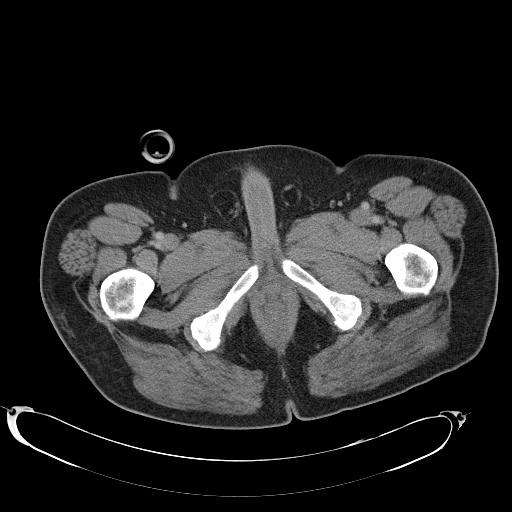
[im 6/102  bone]
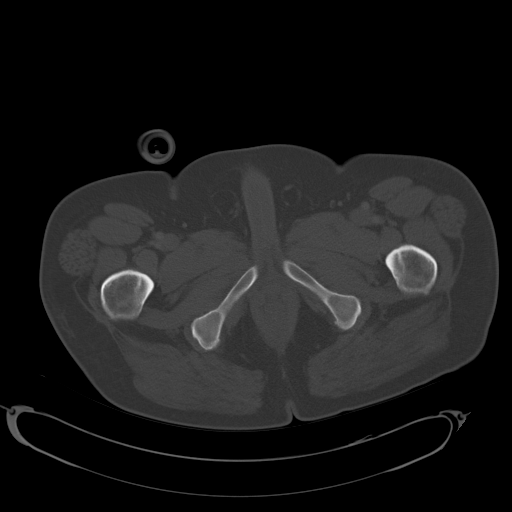
[im 16/102  soft-tissue]
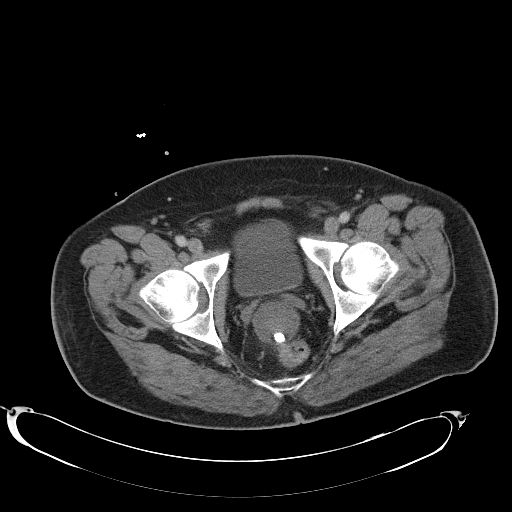
[im 22/102  soft-tissue]
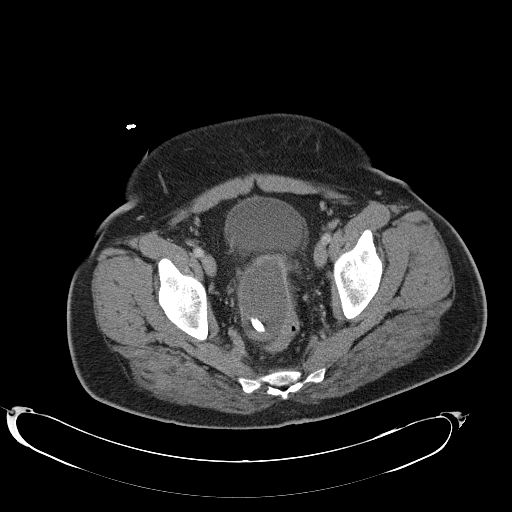
[im 27/102  soft-tissue]
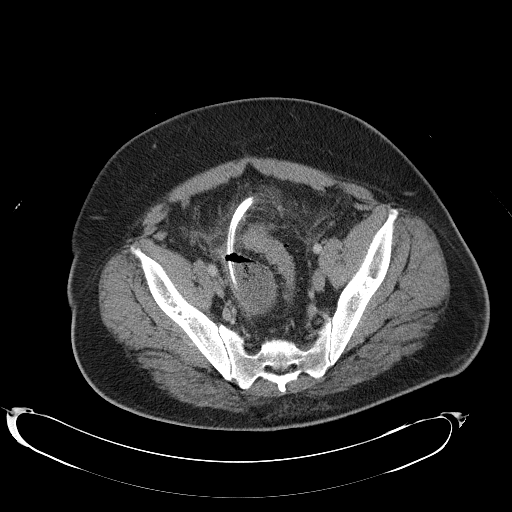
[im 38/102  soft-tissue]
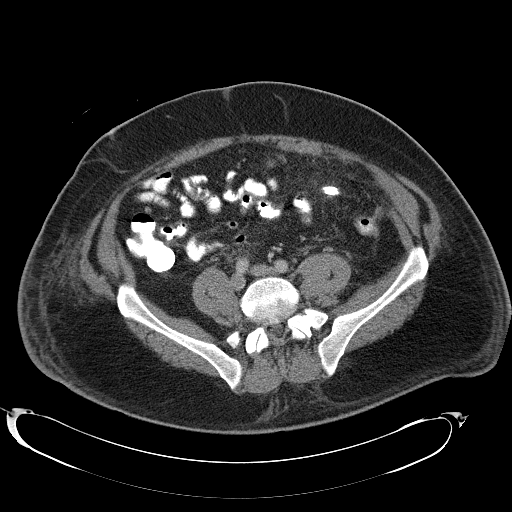
[im 43/102  soft-tissue]
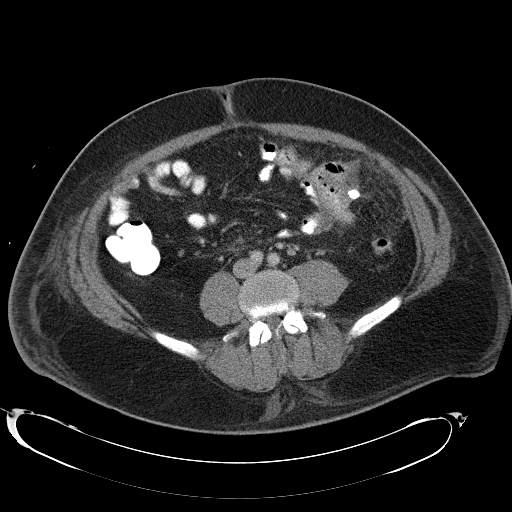
[im 54/102  soft-tissue]
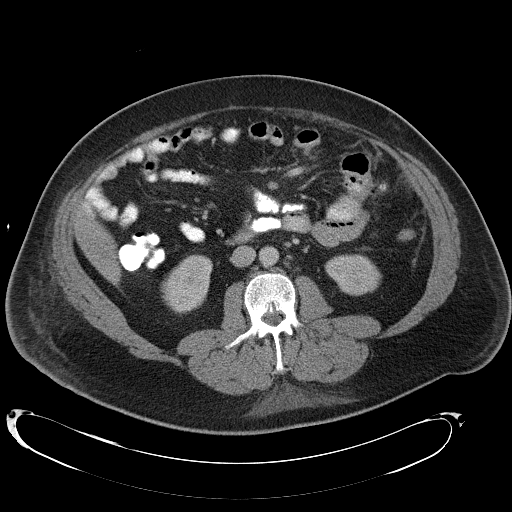
[im 59/102  soft-tissue]
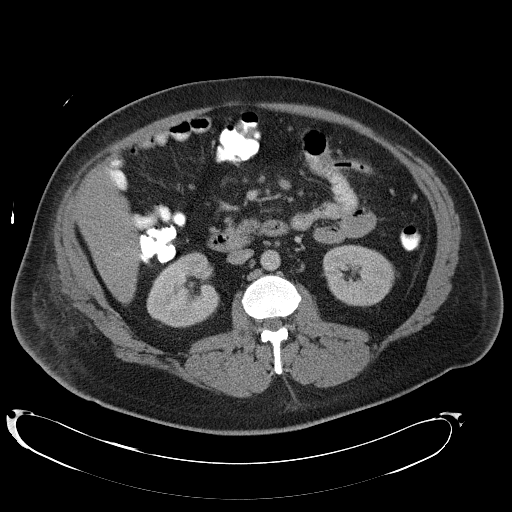
[im 64/102  soft-tissue]
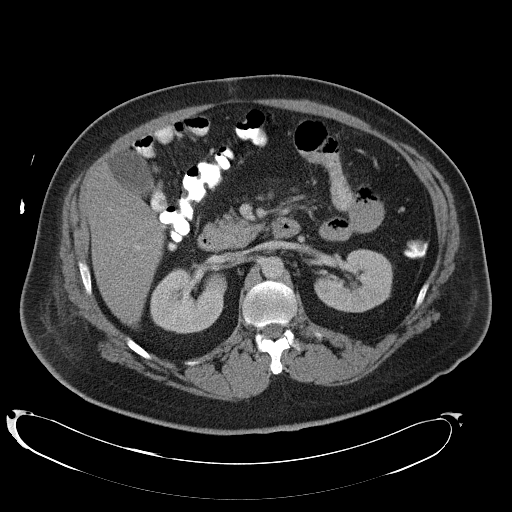
[im 64/102  bone]
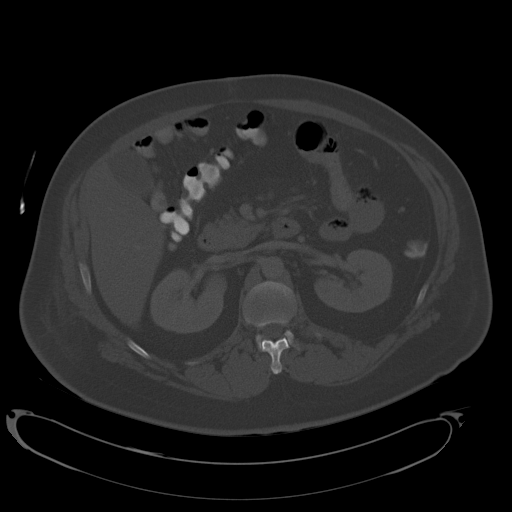
[im 75/102  soft-tissue]
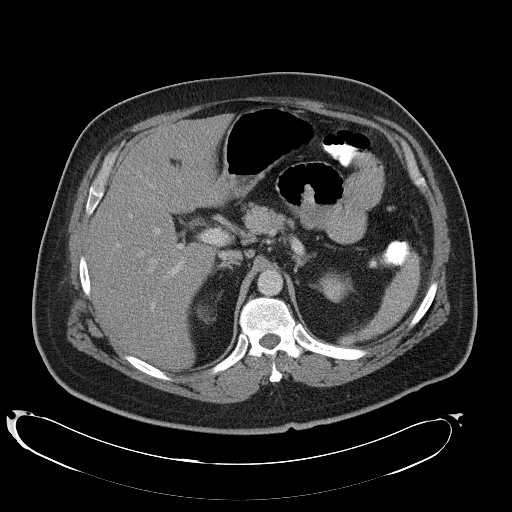
[im 80/102  soft-tissue]
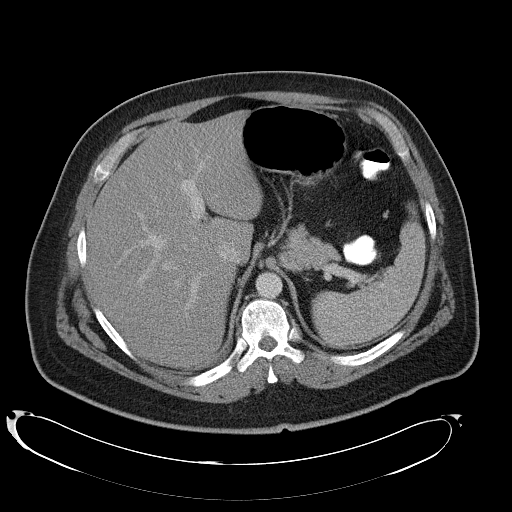
[im 86/102  soft-tissue]
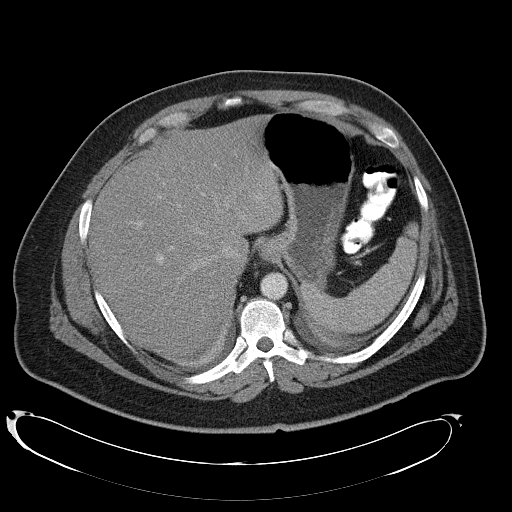
[im 96/102  soft-tissue]
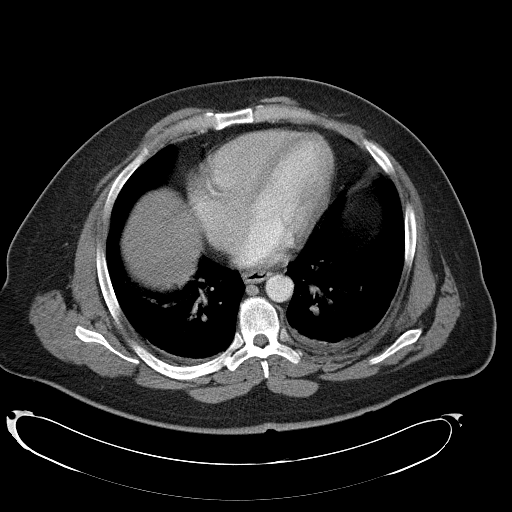

[Series 602: <mpr thick range> · coronal · 1.00mm/px · 3 of 106 slices shown]
[im 36/106  soft-tissue]
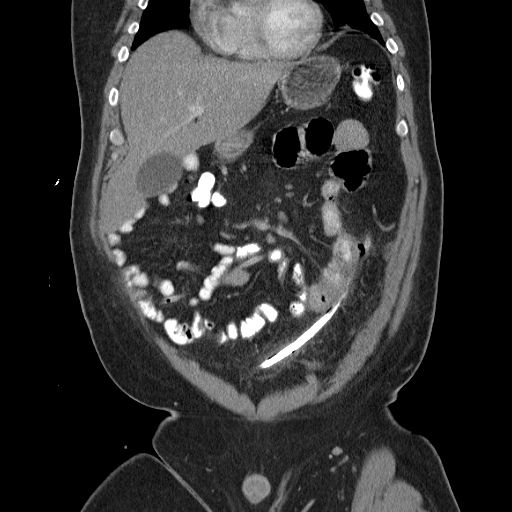
[im 47/106  soft-tissue]
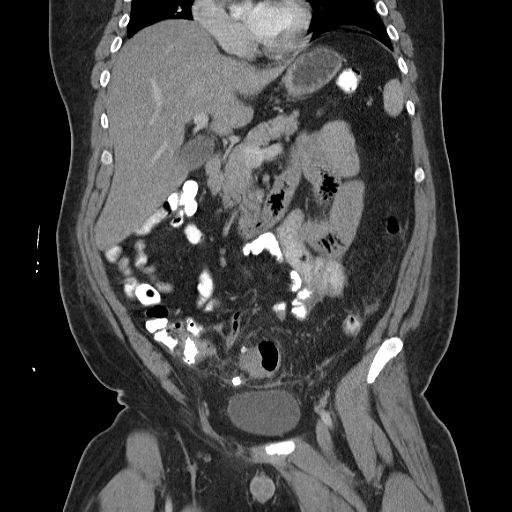
[im 59/106  soft-tissue]
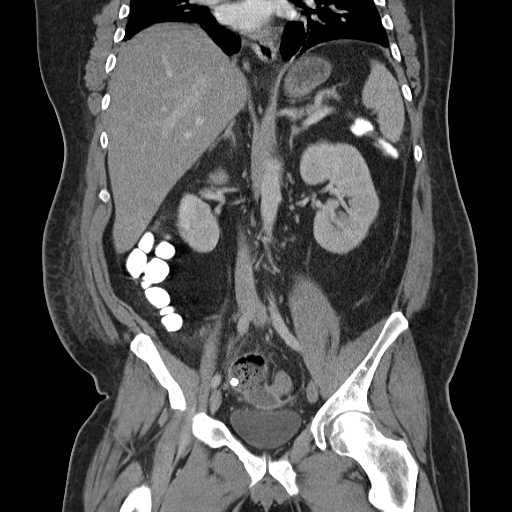

[16 of 46 positions shown; findings below may reference images not displayed]

FINDINGS: At the lung bases, small bilateral pleural effusions and
mild bilateral lower lobe atelectasis are seen.  Previously
visualized retrocrural gas from 12/08/2010 has resolved.  Right-
sided retrocrural lymph node measures 6 mm, stable.

No residual pneumoperitoneum is identified.  There is a tiny single
locule of pneumoretroperitoneum in the left mid abdomen, adjacent
to the inferior mesenteric artery on image number 52.  There is
persistent but slightly decreased stranding in the mesenteric fat
adjacent to the distal sigmoid colon and anterior to the distal
descending colon consistent with inflammatory change.  Several
diverticula arise from the decompressed sigmoid colon.

A drain enters the right lower quadrant abdominal wall, and
terminates in the inferior central pelvis, within the caudad aspect
of the abscess.  The abscess is located in the pouch of Jumper and
extends along to the right of the distal sigmoid colon.  The
abscess is well delineated, with peripheral enhancement and has
multiple locules of air along its superior and nondependent aspect.
This abscess measures up to 7.1 cm AP by 4.4 cm transverse by
approximately 10 cm craniocaudal span.  Previously, the abscess
measured approximately 4.3 x 1.7 cm greatest axial dimension and 6
cm craniocaudal span.

There is suggestion of mild rectal wall thickening, as previously
described.  The urinary bladder, prostate gland and seminal
vesicles have normal appearance.  The appendix is normal. There is
no evidence of bowel obstruction.

The liver, gallbladder, spleen, pancreas, adrenal glands, and
kidneys are within normal limits.  The abdominal aorta is normal in
caliber.

Vertebral bodies are normal in height and alignment.  No acute or
suspicious bony abnormality.
IMPRESSION: 1. The pelvic abscess has enlarged since 12/08/2010.  The tip of
the drain is positioned within the inferior aspect of the abscess
cavity.

2.  Persistent but slightly decreased inflammatory changes in the
sigmoid and distal descending colon mesentery, consistent with
acute diverticulitis.

3. Resolution of pneumoperitoneum.  A single locule of
pneumoretroperitoneum is identified.

4.  Small bilateral pleural effusions and bibasilar atelectasis.

## 2012-09-04 ENCOUNTER — Ambulatory Visit (INDEPENDENT_AMBULATORY_CARE_PROVIDER_SITE_OTHER): Payer: BC Managed Care – PPO | Admitting: Surgery

## 2012-09-04 ENCOUNTER — Encounter (INDEPENDENT_AMBULATORY_CARE_PROVIDER_SITE_OTHER): Payer: Self-pay | Admitting: Surgery

## 2012-09-04 ENCOUNTER — Encounter: Payer: Self-pay | Admitting: Gastroenterology

## 2012-09-04 VITALS — BP 142/78 | HR 80 | Resp 16 | Ht 69.0 in | Wt 216.0 lb

## 2012-09-04 DIAGNOSIS — K5732 Diverticulitis of large intestine without perforation or abscess without bleeding: Secondary | ICD-10-CM

## 2012-09-04 DIAGNOSIS — K5720 Diverticulitis of large intestine with perforation and abscess without bleeding: Secondary | ICD-10-CM

## 2012-09-04 DIAGNOSIS — E669 Obesity, unspecified: Secondary | ICD-10-CM | POA: Insufficient documentation

## 2012-09-04 MED ORDER — NEOMYCIN SULFATE 500 MG PO TABS: 1000.0000 mg | ORAL_TABLET | ORAL | Status: DC

## 2012-09-04 MED ORDER — METRONIDAZOLE 500 MG PO TABS: 500.0000 mg | ORAL_TABLET | ORAL | Status: DC

## 2012-09-04 NOTE — Patient Instructions (Signed)
See the Handout(s) we gave you.  Consider surgery.  Please call our office at [phone] if you wish to schedule surgery or if you have further questions / concerns.   You will require a colonoscopy beforehand.  We will help arrange this through [facility]  Diverticulitis A diverticulum is a small pouch or sac on the colon. Diverticulosis is the presence of these diverticula on the colon. Diverticulitis is the irritation (inflammation) or infection of diverticula. CAUSES  The colon and its diverticula contain bacteria. If food particles block the tiny opening to a diverticulum, the bacteria inside can grow and cause an increase in pressure. This leads to infection and inflammation and is called diverticulitis. SYMPTOMS   Abdominal pain and tenderness. Usually, the pain is located on the left side of your abdomen. However, it could be located elsewhere.  Fever.  Bloating.  Feeling sick to your stomach (nausea).  Throwing up (vomiting).  Abnormal stools. DIAGNOSIS  Your caregiver will take a history and perform a physical exam. Since many things can cause abdominal pain, other tests may be necessary. Tests may include:  Blood tests.  Urine tests.  X-ray of the abdomen.  CT scan of the abdomen. Sometimes, surgery is needed to determine if diverticulitis or other conditions are causing your symptoms. TREATMENT  Most of the time, you can be treated without surgery. Treatment includes:  Resting the bowels by only having liquids for a few days. As you improve, you will need to eat a low-fiber diet.  Intravenous (IV) fluids if you are losing body fluids (dehydrated).  Antibiotic medicines that treat infections may be given.  Pain and nausea medicine, if needed.  Surgery if the inflamed diverticulum has burst. HOME CARE INSTRUCTIONS   Try a clear liquid diet (broth, tea, or water for as long as directed by your caregiver). You may then gradually begin a  low-fiber diet as tolerated. A low-fiber diet is a diet with less than 10 grams of fiber. Choose the foods below to reduce fiber in the diet:  White breads, cereals, rice, and pasta.  Cooked fruits and vegetables or soft fresh fruits and vegetables without the skin.  Ground or well-cooked tender beef, ham, veal, lamb, pork, or poultry.  Eggs and seafood.  After your diverticulitis symptoms have improved, your caregiver may put you on a high-fiber diet. A high-fiber diet includes 14 grams of fiber for every 1000 calories consumed. For a standard 2000 calorie diet, you would need 28 grams of fiber. Follow these diet guidelines to help you increase the fiber in your diet. It is important to slowly increase the amount fiber in your diet to avoid gas, constipation, and bloating.  Choose whole-grain breads, cereals, pasta, and brown rice.  Choose fresh fruits and vegetables with the skin on. Do not overcook vegetables because the more vegetables are cooked, the more fiber is lost.  Choose more nuts, seeds, legumes, dried peas, beans, and lentils.  Look for food products that have greater than 3 grams of fiber per serving on the Nutrition Facts label.  Take all medicine as directed by your caregiver.  If your caregiver has given you a follow-up appointment, it is very important that you go. Not going could result in lasting (chronic) or permanent injury, pain, and disability. If there is any problem keeping the appointment, call to reschedule. SEEK MEDICAL CARE IF:   Your pain does not improve.  You have a hard time advancing your diet beyond clear liquids.  Your bowel movements do not return to normal. SEEK IMMEDIATE MEDICAL CARE IF:   Your pain becomes worse.  You have an oral temperature above 102 F (38.9 C), not controlled by medicine.  You have repeated vomiting.  You have bloody or black, tarry stools.  Symptoms that brought you to your caregiver become worse or are not  getting better. MAKE SURE YOU:   Understand these instructions.  Will watch your condition.  Will get help right away if you are not doing well or get worse. Document Released: 05/05/2005 Document Revised: 10/18/2011 Document Reviewed: 08/31/2010 [facility] Patient Information 2013 [place], [place].

## 2012-09-04 NOTE — Progress Notes (Signed)
Subjective:     Patient ID: Shawn Mosley, male   DOB: 1971/02/18, 42 y.o.   MRN: [id number]  HPI  Shawn Mosley  05/10/1971 [id number]  Patient Care Team: Provider Default, MD as PCP - General Rachael Fee, MD as Consulting Physician (Gastroenterology)  This patient is a 42 y.o.male who presents today for surgical evaluation to reconsider colectomy for diverticulitis.   Pleasant obese male.  Developed severe diverticulitis with perforation and abscess.  Required laparoscopic washout and drain.  Developed a late delayed fistula.  Eventually closed a month later.  Was considering elective colon resection.  However developed pulmonary emboli and required full anticoagulation.  He has now completed a year treatment and wishes to reconsider surgery.  He never obtained a colonoscopy due to concerns of proceeding on full anticoagulation.  Has two soft bowel movements a day.  Not in any fiber.  Does get some intermittent episodes of crampy abdominal pain.  Usually periumbilical.  Not postprandial.  No nausea or vomiting.  No fevers or chills.  Does not radiate to his back or side.  No changes in bowel frequency.  Energy level has been rather good.  Patient Active Problem List  Diagnosis  . Diverticulitis of large intestine with perforation  . Calf pain  . Swelling of right extremity    Past Medical History  Diagnosis Date  . Obese   . Diverticulitis   . Fistula   . Obesity (BMI 30-39.9)     Past Surgical History  Procedure Date  . Colectomy 12/10/10    Diverticular Abscess     History   Social History  . Marital Status: Divorced    Spouse Name: N/A    Number of Children: 3  . Years of Education: N/A   Occupational History  . [employer]     Social History Main Topics  . Smoking status: Never Smoker   . Smokeless tobacco: Never Used  . Alcohol Use: Yes     Comment: 2 daily   . Drug Use: No  . Sexually Active: Not on file   Other Topics Concern  . Not on file    Social History Narrative   2 caffeine drinks daily     Family History  Problem Relation Age of Onset  . Liver cancer Father   . Colon cancer Neg Hx     Current Outpatient Prescriptions  Medication Sig Dispense Refill  . aspirin 325 MG tablet Take 325 mg by mouth daily.        . Probiotic Product (PROBIOTIC DAILY PO) Take by mouth.      . testosterone cypionate (DEPOTESTOTERONE CYPIONATE) 200 MG/ML injection       . metroNIDAZOLE (FLAGYL) 500 MG tablet Take 1 tablet (500 mg total) by mouth as directed. Take 2 pills (=1000mg ) by mouth at 1pm, 3pm, and 10pm the day before your colorectal operation  6 tablet  0  . neomycin (MYCIFRADIN) 500 MG tablet Take 2 tablets (1,000 mg total) by mouth as directed. Take 2 pills (=1000mg ) by mouth at 1pm, 3pm, and 10pm the day before your colorectal operation  6 tablet  0     No Known Allergies  BP 142/78  Pulse 80  Resp 16  Ht 5\' 9"  (1.753 m)  Wt 216 lb (97.977 kg)  BMI 31.90 kg/m2  No results found.   Review of Systems  Constitutional: Negative for fever, chills and diaphoresis.  HENT: Negative for nosebleeds, sore throat, facial swelling, mouth sores, trouble  swallowing and ear discharge.   Eyes: Negative for photophobia, discharge and visual disturbance.  Respiratory: Negative for choking, chest tightness, shortness of breath and stridor.   Cardiovascular: Negative for chest pain and palpitations.  Gastrointestinal: Negative for nausea, vomiting, abdominal pain, diarrhea, constipation, blood in stool, abdominal distention, anal bleeding and rectal pain.  Genitourinary: Negative for dysuria, urgency, difficulty urinating and testicular pain.  Musculoskeletal: Negative for myalgias, back pain, arthralgias and gait problem.  Skin: Negative for color change, pallor, rash and wound.  Neurological: Negative for dizziness, speech difficulty, weakness, numbness and headaches.  Hematological: Negative for adenopathy. Does not bruise/bleed  easily.  Psychiatric/Behavioral: Negative for hallucinations, confusion and agitation.       Objective:   Physical Exam  Constitutional: He is oriented to person, place, and time. He appears well-developed and well-nourished. No distress.  HENT:  Head: Normocephalic.  Mouth/Throat: Oropharynx is clear and moist. No oropharyngeal exudate.  Eyes: Conjunctivae normal and EOM are normal. Pupils are equal, round, and reactive to light. No scleral icterus.  Neck: Normal range of motion. Neck supple. No tracheal deviation present.  Cardiovascular: Normal rate, regular rhythm and intact distal pulses.   Pulmonary/Chest: Effort normal and breath sounds normal. No respiratory distress.  Abdominal: Soft. He exhibits no distension. There is no tenderness. There is no rigidity, no guarding, no tenderness at McBurney's point and negative Murphy's sign. No hernia. Hernia confirmed negative in the right inguinal area and confirmed negative in the left inguinal area.       Obese but soft.  Musculoskeletal: Normal range of motion. He exhibits no tenderness.  Lymphadenopathy:    He has no cervical adenopathy.       Right: No inguinal adenopathy present.       Left: No inguinal adenopathy present.  Neurological: He is alert and oriented to person, place, and time. No cranial nerve deficit. He exhibits normal muscle tone. Coordination normal.  Skin: Skin is warm and dry. No rash noted. He is not diaphoretic. No erythema. No pallor.  Psychiatric: He has a normal mood and affect. His behavior is normal. Judgment and thought content normal.       Assessment:     Severe episode of diverticulitis with perforation/abscess and delayed fistula formation summer 2012.  Need for sigmoid colectomy.  Pulmonary embolism requiring full anticoagulation, now resolved.    Plan:     Given that he is only in his 15s and had a severe attack requiring emergency surgery with abscess and fistula formation, I think he is at  reasonably high risk in his lifetime for getting recurrent diverticulitis.  I still recommend elective surgery.  I still recommend consideration of a colonoscopy preoperatively.  He is interested in doing this.  I discussed the procedure with him:  The anatomy & physiology of the digestive tract was discussed.  The pathophysiology was discussed.  Natural history risks without surgery was discussed.   I feel the risks of no intervention will lead to serious problems that outweigh the operative risks; therefore, I recommended a partial colectomy to remove the pathology.  Laparoscopic & open techniques were discussed.   Risks such as bleeding, infection, abscess, leak, reoperation, possible ostomy, hernia, heart attack, death, and other risks were discussed.  I noted a good likelihood this will help address the problem.   Goals of post-operative recovery were discussed as well.  We will work to minimize complications.  An educational handout on the pathology was given as well.  Questions were  answered.  The patient expresses understanding & wishes to proceed with surgery.  We will try and help set up a colonoscopy preoperatively to make sure there are no surprises such as an endoluminal cancer or colitis or other issues that may need to be separately addressed

## 2012-09-05 ENCOUNTER — Telehealth: Payer: Self-pay

## 2012-09-05 NOTE — Telephone Encounter (Signed)
Shawn Mosley get him in soon for colonoscopy (next 1-2 weeks). THanks Anneli Bing, He needs colonoscopy, moderate sedation L[facility], for h/o diverticulitis. Thanks dj ----- Message ----- From: Ardeth Sportsman, MD Sent: 09/04/2012 6:05 PM To: Rachael Fee, MD Severe episode of diverticulitis with perforation abscess and fistula status post laparoscopic washout in 2012. Developed thromboembolic event and needed full anticoagulation for a year. Now off all that. I think he would benefit from sigmoid colectomy. Needs colonoscopy to rule out cancer or other etiology.

## 2012-09-06 NOTE — Telephone Encounter (Signed)
Left message on machine to call back  

## 2012-09-06 NOTE — Telephone Encounter (Signed)
Pt has been scheduled and previsit as well

## 2012-09-11 ENCOUNTER — Ambulatory Visit (AMBULATORY_SURGERY_CENTER): Payer: BC Managed Care – PPO | Admitting: *Deleted

## 2012-09-11 VITALS — Ht 69.0 in | Wt 221.0 lb

## 2012-09-11 DIAGNOSIS — K5732 Diverticulitis of large intestine without perforation or abscess without bleeding: Secondary | ICD-10-CM

## 2012-09-11 MED ORDER — MOVIPREP 100 G PO SOLR: ORAL | Status: DC

## 2012-09-12 ENCOUNTER — Encounter: Payer: Self-pay | Admitting: Gastroenterology

## 2012-09-18 ENCOUNTER — Encounter: Payer: Self-pay | Admitting: Gastroenterology

## 2012-09-18 ENCOUNTER — Ambulatory Visit (AMBULATORY_SURGERY_CENTER): Payer: BC Managed Care – PPO | Admitting: Gastroenterology

## 2012-09-18 VITALS — BP 110/71 | HR 84 | Temp 98.1°F | Resp 15 | Ht 69.0 in | Wt 221.0 lb

## 2012-09-18 DIAGNOSIS — K639 Disease of intestine, unspecified: Secondary | ICD-10-CM

## 2012-09-18 DIAGNOSIS — K6289 Other specified diseases of anus and rectum: Secondary | ICD-10-CM

## 2012-09-18 DIAGNOSIS — K5732 Diverticulitis of large intestine without perforation or abscess without bleeding: Secondary | ICD-10-CM

## 2012-09-18 MED ORDER — SODIUM CHLORIDE 0.9 % IV SOLN: 500.0000 mL | INTRAVENOUS | Status: DC

## 2012-09-18 NOTE — Op Note (Signed)
[facility] [address] [place], [address]   COLONOSCOPY PROCEDURE REPORT  PATIENT: Shawn Mosley, Shawn Mosley  MR#: [id number] BIRTHDATE: [birth year] , 41  yrs. old GENDER: Male ENDOSCOPIST: Rachael Fee, MD PROCEDURE DATE:  09/18/2012 PROCEDURE:   Colonoscopy with biopsy and Submucosal injection, any substance ASA CLASS:   Class II INDICATIONS:severe diverticulitis 2013. MEDICATIONS: Fentanyl 75 mcg IV, Versed 10 mg IV, and These medications were titrated to patient response per physician's verbal order  DESCRIPTION OF PROCEDURE:   After the risks benefits and alternatives of the procedure were thoroughly explained, informed consent was obtained.  A digital rectal exam revealed no abnormalities of the rectum.   The LB CF-[id number]  endoscope was introduced through the anus and advanced to the cecum, which was identified by both the appendix and ileocecal valve. No adverse events experienced.   The quality of the prep was good, using MoviPrep  The instrument was then slowly withdrawn as the colon was fully examined.  COLON FINDINGS: The sigmoid colon was edematous and narrowed throughout most of its course.  This chronically edematous appearance continued through to proximal rectum where there was an area of irregular appearing, somewhat nodular mucosa (12cm from anus).  This site in proximal rectum seems likely related to his severe diverticultis, delayed fistual but biopsies were taken to rule out neoplasm and then the site was labeled with 2 injections of [place] Ink.  The examination was otherwise normal.  Retroflexed views revealed no abnormalities. The time to cecum=3 minutes 34 seconds.  Withdrawal time=9 minutes 26 seconds.  The scope was withdrawn and the procedure completed. COMPLICATIONS: There were no complications.  ENDOSCOPIC IMPRESSION: The sigmoid colon was edematous and narrowed throughout most of its course.  This chronically edematous  appearance continued through to proximal rectum where there was an area of irregular appearing, somewhat nodular mucosa (12cm from anus).  This site in proximal rectum seems likely related to his severe diverticultis, delayed fistual but biopsies were taken to rule out neoplasm and then the site was labeled with 2 injections of [place] Ink.  The examination was otherwise normal  RECOMMENDATIONS: Await final pathology.  As long as no neoplasm in biopsies (which I doubt) then OK to proceed with segmental colectomy with distal margin at least to the site of [place] Ink injection site (at 12cm from anus).   eSigned:  Rachael Fee, MD 09/18/2012 2:26 PM

## 2012-09-18 NOTE — Patient Instructions (Signed)
YOU HAD AN ENDOSCOPIC PROCEDURE TODAY AT THE [facility]: Refer to the procedure report that was given to you for any specific questions about what was found during the examination.  If the procedure report does not answer your questions, please call your gastroenterologist to clarify.  If you requested that your care partner not be given the details of your procedure findings, then the procedure report has been included in a sealed envelope for you to review at your convenience later.  YOU SHOULD EXPECT: Some feelings of bloating in the abdomen. Passage of more gas than usual.  Walking can help get rid of the air that was put into your GI tract during the procedure and reduce the bloating. If you had a lower endoscopy (such as a colonoscopy or flexible sigmoidoscopy) you may notice spotting of blood in your stool or on the toilet paper. If you underwent a bowel prep for your procedure, then you may not have a normal bowel movement for a few days.  DIET: Your first meal following the procedure should be a light meal and then it is ok to progress to your normal diet.  A half-sandwich or bowl of soup is an example of a good first meal.  Heavy or fried foods are harder to digest and may make you feel nauseous or bloated.  Likewise meals heavy in dairy and vegetables can cause extra gas to form and this can also increase the bloating.  Drink plenty of fluids but you should avoid alcoholic beverages for 24 hours.  ACTIVITY: Your care partner should take you home directly after the procedure.  You should plan to take it easy, moving slowly for the rest of the day.  You can resume normal activity the day after the procedure however you should NOT DRIVE or use heavy machinery for 24 hours (because of the sedation medicines used during the test).    SYMPTOMS TO REPORT IMMEDIATELY: A gastroenterologist can be reached at any hour.  During normal business hours, 8:30 AM to 5:00 PM Monday through Friday,  call [phone].  After hours and on weekends, please call the GI answering service at [phone] who will take a message and have the physician on call contact you.   Following lower endoscopy (colonoscopy or flexible sigmoidoscopy):  Excessive amounts of blood in the stool  Significant tenderness or worsening of abdominal pains  Swelling of the abdomen that is new, acute  Fever of 100F or higher    FOLLOW UP: If any biopsies were taken you will be contacted by phone or by letter within the next 1-3 weeks.  Call your gastroenterologist if you have not heard about the biopsies in 3 weeks.  Our staff will call the home number listed on your records the next business day following your procedure to check on you and address any questions or concerns that you may have at that time regarding the information given to you following your procedure. This is a courtesy call and so if there is no answer at the home number and we have not heard from you through the emergency physician on call, we will assume that you have returned to your regular daily activities without incident.  SIGNATURES/CONFIDENTIALITY: You and/or your care partner have signed paperwork which will be entered into your electronic medical record.  These signatures attest to the fact that that the information above on your After Visit Summary has been reviewed and is understood.  Full responsibility of the confidentiality   of this discharge information lies with you and/or your care-partner.     

## 2012-09-18 NOTE — Progress Notes (Signed)
Patient did not experience any of the following events: a burn prior to discharge; a fall within the facility; wrong site/side/patient/procedure/implant event; or a hospital transfer or hospital admission upon discharge from the facility. (G8907) Patient did not have preoperative order for IV antibiotic SSI prophylaxis. (G8918)  

## 2012-09-19 ENCOUNTER — Telehealth: Payer: Self-pay | Admitting: *Deleted

## 2012-09-19 NOTE — Telephone Encounter (Signed)
  Follow up Call-  Call back number 09/18/2012  Post procedure Call Back phone  # [phone]  Permission to leave phone message Yes    [facility]

## 2012-09-27 ENCOUNTER — Encounter: Payer: Self-pay | Admitting: Gastroenterology

## 2012-09-28 ENCOUNTER — Encounter (HOSPITAL_COMMUNITY): Payer: Self-pay | Admitting: Pharmacy Technician

## 2012-10-06 ENCOUNTER — Encounter (HOSPITAL_COMMUNITY): Payer: Self-pay

## 2012-10-06 ENCOUNTER — Encounter (HOSPITAL_COMMUNITY)
Admission: RE | Admit: 2012-10-06 | Discharge: 2012-10-06 | Disposition: A | Discharge status: Home / Self Care | Payer: BC Managed Care – PPO | Source: Ambulatory Visit | Attending: Surgery | Admitting: Surgery

## 2012-10-06 DIAGNOSIS — Z01812 Encounter for preprocedural laboratory examination: Secondary | ICD-10-CM | POA: Insufficient documentation

## 2012-10-06 LAB — CBC
HCT: 44.5 % (ref 39.0–52.0)
Hemoglobin: 15 g/dL (ref 13.0–17.0)
MCH: 29.6 pg (ref 26.0–34.0)
MCHC: 33.7 g/dL (ref 30.0–36.0)
MCV: 87.9 fL (ref 78.0–100.0)
Platelets: 326 10*3/uL (ref 150–400)
RBC: 5.06 MIL/uL (ref 4.22–5.81)
RDW: 13.4 % (ref 11.5–15.5)
WBC: 9.6 10*3/uL (ref 4.0–10.5)

## 2012-10-06 LAB — SURGICAL PCR SCREEN
MRSA, PCR: NEGATIVE
Staphylococcus aureus: NEGATIVE

## 2012-10-06 NOTE — Patient Instructions (Signed)
20 NARAYAN SCULL  10/06/2012   Your procedure is scheduled on: 10/12/12  Report to [facility] at [phone].  Call this number if you have problems the morning of surgery [phone]: [phone]   Remember:   Do not eat food or drink liquids After Midnight.     Do not wear jewelry, make-up or nail polish.  Do not wear lotions, powders, or perfumes. You may wear deodorant.  Do not shave 48 hours prior to surgery. Men may shave face and neck.  Do not bring valuables to the hospital.  Contacts, dentures or bridgework may not be worn into surgery.  Leave suitcase in the car. After surgery it may be brought to your room.  For patients admitted to the hospital, checkout time is 11:00 AM the day of discharge.    Please read over the following fact sheets that you were given: MRSA Information, blood fact sheet   Birdie Sons, RN  pre op nurse call if needed [phone]    FAILURE TO FOLLOW THESE INSTRUCTIONS MAY RESULT IN CANCELLATION OF YOUR SURGERY   Patient Signature: ___________________________________________

## 2012-10-12 ENCOUNTER — Encounter (HOSPITAL_COMMUNITY): Payer: Self-pay | Admitting: Anesthesiology

## 2012-10-12 ENCOUNTER — Inpatient Hospital Stay (HOSPITAL_COMMUNITY)
Admission: RE | Admit: 2012-10-12 | Discharge: 2012-10-16 | DRG: 146 | Disposition: A | Discharge status: Home / Self Care | Payer: BC Managed Care – PPO | Source: Ambulatory Visit | Attending: Surgery | Admitting: Surgery

## 2012-10-12 ENCOUNTER — Encounter (HOSPITAL_COMMUNITY): Payer: Self-pay | Admitting: *Deleted

## 2012-10-12 ENCOUNTER — Encounter (HOSPITAL_COMMUNITY)
Admission: RE | Disposition: A | Discharge status: Home / Self Care | Payer: Self-pay | Source: Ambulatory Visit | Attending: Surgery

## 2012-10-12 ENCOUNTER — Ambulatory Visit (HOSPITAL_COMMUNITY): Payer: BC Managed Care – PPO | Admitting: Anesthesiology

## 2012-10-12 DIAGNOSIS — Z6832 Body mass index (BMI) 32.0-32.9, adult: Secondary | ICD-10-CM

## 2012-10-12 DIAGNOSIS — K5720 Diverticulitis of large intestine with perforation and abscess without bleeding: Secondary | ICD-10-CM | POA: Diagnosis present

## 2012-10-12 DIAGNOSIS — K5730 Diverticulosis of large intestine without perforation or abscess without bleeding: Secondary | ICD-10-CM

## 2012-10-12 DIAGNOSIS — G4730 Sleep apnea, unspecified: Secondary | ICD-10-CM | POA: Diagnosis present

## 2012-10-12 DIAGNOSIS — K5732 Diverticulitis of large intestine without perforation or abscess without bleeding: Principal | ICD-10-CM | POA: Diagnosis present

## 2012-10-12 DIAGNOSIS — Z86711 Personal history of pulmonary embolism: Secondary | ICD-10-CM

## 2012-10-12 DIAGNOSIS — E669 Obesity, unspecified: Secondary | ICD-10-CM | POA: Diagnosis present

## 2012-10-12 DIAGNOSIS — N321 Vesicointestinal fistula: Secondary | ICD-10-CM | POA: Diagnosis present

## 2012-10-12 DIAGNOSIS — Z9049 Acquired absence of other specified parts of digestive tract: Secondary | ICD-10-CM

## 2012-10-12 DIAGNOSIS — Z9889 Other specified postprocedural states: Secondary | ICD-10-CM

## 2012-10-12 DIAGNOSIS — K660 Peritoneal adhesions (postprocedural) (postinfection): Secondary | ICD-10-CM

## 2012-10-12 HISTORY — PX: BLADDER REPAIR: SHX76

## 2012-10-12 HISTORY — PX: LAPAROSCOPIC LOW ANTERIOR RESECTION: SHX5904

## 2012-10-12 HISTORY — PX: PROCTOSCOPY: SHX2266

## 2012-10-12 LAB — CREATININE, SERUM
Creatinine, Ser: 1.1 mg/dL (ref 0.50–1.35)
GFR calc Af Amer: >90 mL/min (ref >90)
GFR calc non Af Amer: 80 mL/min — ABNORMAL LOW (ref >90)

## 2012-10-12 LAB — TYPE AND SCREEN
ABO/RH(D): A POS
Antibody Screen: NEGATIVE

## 2012-10-12 LAB — CBC
HCT: 39.7 % (ref 39.0–52.0)
Hemoglobin: 13.5 g/dL (ref 13.0–17.0)
MCH: 29.9 pg (ref 26.0–34.0)
MCHC: 34 g/dL (ref 30.0–36.0)
MCV: 87.8 fL (ref 78.0–100.0)
Platelets: 367 10*3/uL (ref 150–400)
RBC: 4.52 MIL/uL (ref 4.22–5.81)
RDW: 13 % (ref 11.5–15.5)
WBC: 20.3 10*3/uL — ABNORMAL HIGH (ref 4.0–10.5)

## 2012-10-12 LAB — ABO/RH: ABO/RH(D): A POS

## 2012-10-12 SURGERY — PROCTOSCOPY
Anesthesia: General | Site: Abdomen

## 2012-10-12 MED ORDER — ACETAMINOPHEN 10 MG/ML IV SOLN
INTRAVENOUS | Status: AC
  Filled 2012-10-12: qty 100

## 2012-10-12 MED ORDER — PHENYLEPHRINE HCL 10 MG/ML IJ SOLN
INTRAMUSCULAR | Status: DC | PRN
  Administered 2012-10-12: 40 ug via INTRAVENOUS
  Administered 2012-10-12: 80 ug via INTRAVENOUS
  Administered 2012-10-12: 40 ug via INTRAVENOUS

## 2012-10-12 MED ORDER — NAPROXEN 500 MG PO TABS
500.0000 mg | ORAL_TABLET | Freq: Three times a day (TID) | ORAL | Status: DC
  Administered 2012-10-12: 500 mg via ORAL
  Filled 2012-10-12 (×5): qty 1

## 2012-10-12 MED ORDER — BUPIVACAINE-EPINEPHRINE 0.25% -1:200000 IJ SOLN
INTRAMUSCULAR | Status: AC
  Filled 2012-10-12: qty 1

## 2012-10-12 MED ORDER — METOPROLOL TARTRATE 1 MG/ML IV SOLN
5.0000 mg | Freq: Four times a day (QID) | INTRAVENOUS | Status: DC | PRN
  Filled 2012-10-12: qty 5

## 2012-10-12 MED ORDER — BUPIVACAINE-EPINEPHRINE 0.25% -1:200000 IJ SOLN
INTRAMUSCULAR | Status: DC | PRN
  Administered 2012-10-12: 50 mL

## 2012-10-12 MED ORDER — HYDROMORPHONE HCL PF 1 MG/ML IJ SOLN
INTRAMUSCULAR | Status: AC
  Filled 2012-10-12: qty 1

## 2012-10-12 MED ORDER — ROCURONIUM BROMIDE 100 MG/10ML IV SOLN
INTRAVENOUS | Status: DC | PRN
  Administered 2012-10-12: 20 mg via INTRAVENOUS
  Administered 2012-10-12: 50 mg via INTRAVENOUS
  Administered 2012-10-12: 5 mg via INTRAVENOUS
  Administered 2012-10-12 (×2): 10 mg via INTRAVENOUS

## 2012-10-12 MED ORDER — METRONIDAZOLE 500 MG PO TABS: 500.0000 mg | ORAL_TABLET | ORAL | Status: DC

## 2012-10-12 MED ORDER — LACTATED RINGERS IV SOLN
INTRAVENOUS | Status: DC
  Administered 2012-10-12: 1000 mL via INTRAVENOUS
  Administered 2012-10-12 (×2): via INTRAVENOUS

## 2012-10-12 MED ORDER — FENTANYL CITRATE 0.05 MG/ML IJ SOLN
INTRAMUSCULAR | Status: DC | PRN
  Administered 2012-10-12: 100 ug via INTRAVENOUS
  Administered 2012-10-12 (×2): 50 ug via INTRAVENOUS
  Administered 2012-10-12 (×2): 100 ug via INTRAVENOUS

## 2012-10-12 MED ORDER — ACETAMINOPHEN 325 MG PO TABS: 325.0000 mg | ORAL_TABLET | Freq: Four times a day (QID) | ORAL | Status: DC | PRN

## 2012-10-12 MED ORDER — SACCHAROMYCES BOULARDII 250 MG PO CAPS
250.0000 mg | ORAL_CAPSULE | Freq: Two times a day (BID) | ORAL | Status: DC
  Administered 2012-10-12 – 2012-10-16 (×8): 250 mg via ORAL
  Filled 2012-10-12 (×9): qty 1

## 2012-10-12 MED ORDER — HEPARIN SODIUM (PORCINE) 5000 UNIT/ML IJ SOLN
5000.0000 [IU] | Freq: Three times a day (TID) | INTRAMUSCULAR | Status: DC
  Administered 2012-10-13 – 2012-10-16 (×10): 5000 [IU] via SUBCUTANEOUS
  Filled 2012-10-12 (×13): qty 1

## 2012-10-12 MED ORDER — OXYCODONE HCL 5 MG PO TABS: 5.0000 mg | ORAL_TABLET | ORAL | Status: DC | PRN

## 2012-10-12 MED ORDER — NEOMYCIN SULFATE 500 MG PO TABS: 500.0000 mg | ORAL_TABLET | ORAL | Status: DC

## 2012-10-12 MED ORDER — ALVIMOPAN 12 MG PO CAPS
12.0000 mg | ORAL_CAPSULE | Freq: Once | ORAL | Status: AC
  Administered 2012-10-12: 12 mg via ORAL
  Filled 2012-10-12: qty 1

## 2012-10-12 MED ORDER — PROMETHAZINE HCL 25 MG/ML IJ SOLN: 12.5000 mg | Freq: Four times a day (QID) | INTRAMUSCULAR | Status: DC | PRN

## 2012-10-12 MED ORDER — PROPOFOL 10 MG/ML IV BOLUS
INTRAVENOUS | Status: DC | PRN
  Administered 2012-10-12: 200 mg via INTRAVENOUS

## 2012-10-12 MED ORDER — HYDROMORPHONE HCL PF 1 MG/ML IJ SOLN
0.2500 mg | INTRAMUSCULAR | Status: DC | PRN
  Administered 2012-10-12 (×2): 0.5 mg via INTRAVENOUS

## 2012-10-12 MED ORDER — CHLORHEXIDINE GLUCONATE 0.12 % MT SOLN
15.0000 mL | Freq: Two times a day (BID) | OROMUCOSAL | Status: DC
  Administered 2012-10-12 – 2012-10-15 (×5): 15 mL via OROMUCOSAL
  Filled 2012-10-12 (×10): qty 15

## 2012-10-12 MED ORDER — GLYCOPYRROLATE 0.2 MG/ML IJ SOLN
INTRAMUSCULAR | Status: DC | PRN
  Administered 2012-10-12: .6 mg via INTRAVENOUS

## 2012-10-12 MED ORDER — MAGIC MOUTHWASH
15.0000 mL | Freq: Four times a day (QID) | ORAL | Status: DC | PRN
  Administered 2012-10-13: 15 mL via ORAL
  Filled 2012-10-12: qty 15

## 2012-10-12 MED ORDER — LIDOCAINE HCL (CARDIAC) 20 MG/ML IV SOLN
INTRAVENOUS | Status: DC | PRN
  Administered 2012-10-12: 80 mg via INTRAVENOUS

## 2012-10-12 MED ORDER — BUPIVACAINE ON-Q PAIN PUMP (FOR ORDER SET NO CHG)
INJECTION | Status: DC
  Filled 2012-10-12: qty 1

## 2012-10-12 MED ORDER — BUPIVACAINE 0.25 % ON-Q PUMP DUAL CATH 300 ML
INJECTION | Status: DC | PRN
  Administered 2012-10-12: 300 mL

## 2012-10-12 MED ORDER — NEOSTIGMINE METHYLSULFATE 1 MG/ML IJ SOLN
INTRAMUSCULAR | Status: DC | PRN
  Administered 2012-10-12: 5 mg via INTRAVENOUS

## 2012-10-12 MED ORDER — ASPIRIN EC 81 MG PO TBEC
81.0000 mg | DELAYED_RELEASE_TABLET | Freq: Every day | ORAL | Status: DC
  Administered 2012-10-12 – 2012-10-16 (×5): 81 mg via ORAL
  Filled 2012-10-12 (×5): qty 1

## 2012-10-12 MED ORDER — ZOLPIDEM TARTRATE 5 MG PO TABS: 5.0000 mg | ORAL_TABLET | Freq: Every evening | ORAL | Status: DC | PRN

## 2012-10-12 MED ORDER — BUPIVACAINE 0.25 % ON-Q PUMP DUAL CATH 300 ML
300.0000 mL | INJECTION | Status: DC
  Filled 2012-10-12: qty 300

## 2012-10-12 MED ORDER — ACETAMINOPHEN 500 MG PO TABS: 1000.0000 mg | ORAL_TABLET | Freq: Four times a day (QID) | ORAL | Status: DC | PRN

## 2012-10-12 MED ORDER — LIP MEDEX EX OINT
1.0000 "application " | TOPICAL_OINTMENT | Freq: Two times a day (BID) | CUTANEOUS | Status: DC
  Administered 2012-10-12 – 2012-10-16 (×8): 1 via TOPICAL

## 2012-10-12 MED ORDER — ALVIMOPAN 12 MG PO CAPS
12.0000 mg | ORAL_CAPSULE | Freq: Two times a day (BID) | ORAL | Status: DC
  Administered 2012-10-13 – 2012-10-16 (×6): 12 mg via ORAL
  Filled 2012-10-12 (×8): qty 1

## 2012-10-12 MED ORDER — KCL IN DEXTROSE-NACL 40-5-0.9 MEQ/L-%-% IV SOLN
INTRAVENOUS | Status: DC
  Administered 2012-10-12: 1000 mL via INTRAVENOUS
  Filled 2012-10-12 (×4): qty 1000

## 2012-10-12 MED ORDER — HEPARIN SODIUM (PORCINE) 5000 UNIT/ML IJ SOLN
5000.0000 [IU] | Freq: Once | INTRAMUSCULAR | Status: AC
  Administered 2012-10-12: 5000 [IU] via SUBCUTANEOUS
  Filled 2012-10-12: qty 1

## 2012-10-12 MED ORDER — SUCCINYLCHOLINE CHLORIDE 20 MG/ML IJ SOLN
INTRAMUSCULAR | Status: DC | PRN
  Administered 2012-10-12: 100 mg via INTRAVENOUS

## 2012-10-12 MED ORDER — HYDROMORPHONE HCL PF 1 MG/ML IJ SOLN
0.5000 mg | INTRAMUSCULAR | Status: DC | PRN
  Administered 2012-10-12 – 2012-10-15 (×21): 1 mg via INTRAVENOUS
  Filled 2012-10-12 (×8): qty 1
  Filled 2012-10-12: qty 2
  Filled 2012-10-12 (×13): qty 1

## 2012-10-12 MED ORDER — ONDANSETRON HCL 4 MG/2ML IJ SOLN
INTRAMUSCULAR | Status: DC | PRN
  Administered 2012-10-12: 4 mg via INTRAVENOUS

## 2012-10-12 MED ORDER — PROMETHAZINE HCL 25 MG/ML IJ SOLN: 6.2500 mg | INTRAMUSCULAR | Status: DC | PRN

## 2012-10-12 MED ORDER — SODIUM CHLORIDE 0.9 % IV SOLN
INTRAVENOUS | Status: AC
  Administered 2012-10-12: 14:00:00 via INTRAPERITONEAL
  Filled 2012-10-12: qty 6

## 2012-10-12 MED ORDER — DEXTROSE 5 % IV SOLN
2.0000 g | Freq: Two times a day (BID) | INTRAVENOUS | Status: AC
  Administered 2012-10-12: 2 g via INTRAVENOUS
  Filled 2012-10-12: qty 2

## 2012-10-12 MED ORDER — INDIGOTINDISULFONATE SODIUM 8 MG/ML IJ SOLN
INTRAMUSCULAR | Status: DC | PRN
  Administered 2012-10-12: 5 mL via INTRAVENOUS

## 2012-10-12 MED ORDER — BIOTENE DRY MOUTH MT LIQD
15.0000 mL | Freq: Two times a day (BID) | OROMUCOSAL | Status: DC
  Administered 2012-10-13 – 2012-10-15 (×6): 15 mL via OROMUCOSAL

## 2012-10-12 MED ORDER — HYDROMORPHONE HCL PF 1 MG/ML IJ SOLN
INTRAMUSCULAR | Status: DC | PRN
  Administered 2012-10-12: 1 mg via INTRAVENOUS

## 2012-10-12 MED ORDER — METHYLENE BLUE 1 % INJ SOLN
INTRAMUSCULAR | Status: AC
  Filled 2012-10-12: qty 10

## 2012-10-12 MED ORDER — MIDAZOLAM HCL 5 MG/5ML IJ SOLN
INTRAMUSCULAR | Status: DC | PRN
  Administered 2012-10-12: 2 mg via INTRAVENOUS

## 2012-10-12 MED ORDER — ALUM & MAG HYDROXIDE-SIMETH 200-200-20 MG/5ML PO SUSP: 30.0000 mL | Freq: Four times a day (QID) | ORAL | Status: DC | PRN

## 2012-10-12 MED ORDER — CEFOTETAN DISODIUM 2 G IJ SOLR
2.0000 g | INTRAMUSCULAR | Status: AC
  Administered 2012-10-12: 2 g via INTRAVENOUS
  Filled 2012-10-12: qty 2

## 2012-10-12 MED ORDER — ACETAMINOPHEN 10 MG/ML IV SOLN
INTRAVENOUS | Status: DC | PRN
  Administered 2012-10-12: 1000 mg via INTRAVENOUS

## 2012-10-12 SURGICAL SUPPLY — 88 items
APPLIER CLIP ROT 10 11.4 M/L (STAPLE)
BLADE HEX COATED 2.75 (ELECTRODE) ×3 IMPLANT
BLADE SURG 15 STRL LF DISP TIS (BLADE) ×4 IMPLANT
BLADE SURG 15 STRL SS (BLADE) ×2
BLADE SURG ROTATE 9660 (MISCELLANEOUS) IMPLANT
CABLE HIGH FREQUENCY MONO STRZ (ELECTRODE) ×3 IMPLANT
CANISTER SUCTION 2500CC (MISCELLANEOUS) ×6 IMPLANT
CATH KIT ON Q 7.5IN SLV (PAIN MANAGEMENT) ×3 IMPLANT
CELLS DAT CNTRL 66122 CELL SVR (MISCELLANEOUS) IMPLANT
CHLORAPREP W/TINT 26ML (MISCELLANEOUS) ×3 IMPLANT
CLIP APPLIE ROT 10 11.4 M/L (STAPLE) IMPLANT
CLOTH BEACON ORANGE TIMEOUT ST (SAFETY) ×3 IMPLANT
COVER SURGICAL LIGHT HANDLE (MISCELLANEOUS) IMPLANT
DECANTER SPIKE VIAL GLASS SM (MISCELLANEOUS) ×6 IMPLANT
DISSECTOR BLUNT TIP ENDO 5MM (MISCELLANEOUS) IMPLANT
DRAPE LAPAROSCOPIC ABDOMINAL (DRAPES) ×3 IMPLANT
DRAPE LAPAROTOMY T 102X78X121 (DRAPES) IMPLANT
DRAPE UTILITY XL STRL (DRAPES) ×6 IMPLANT
DRAPE WARM FLUID 44X44 (DRAPE) ×6 IMPLANT
DRSG PAD ABDOMINAL 8X10 ST (GAUZE/BANDAGES/DRESSINGS) IMPLANT
DRSG TEGADERM 2-3/8X2-3/4 SM (GAUZE/BANDAGES/DRESSINGS) ×12 IMPLANT
DRSG TEGADERM 4X4.75 (GAUZE/BANDAGES/DRESSINGS) ×6 IMPLANT
ELECT CAUTERY BLADE 6.4 (BLADE) ×3 IMPLANT
ELECT REM PT RETURN 9FT ADLT (ELECTROSURGICAL) ×3
ELECTRODE REM PT RTRN 9FT ADLT (ELECTROSURGICAL) ×2 IMPLANT
GLOVE BIOGEL PI IND STRL 6.5 (GLOVE) ×4 IMPLANT
GLOVE BIOGEL PI IND STRL 7.0 (GLOVE) ×4 IMPLANT
GLOVE BIOGEL PI INDICATOR 6.5 (GLOVE) ×2
GLOVE BIOGEL PI INDICATOR 7.0 (GLOVE) ×2
GLOVE ECLIPSE 8.0 STRL XLNG CF (GLOVE) ×6 IMPLANT
GLOVE INDICATOR 8.0 STRL GRN (GLOVE) ×6 IMPLANT
GOWN STRL NON-REIN LRG LVL3 (GOWN DISPOSABLE) ×6 IMPLANT
GOWN STRL REIN XL XLG (GOWN DISPOSABLE) ×18 IMPLANT
KIT BASIN OR (CUSTOM PROCEDURE TRAY) ×3 IMPLANT
LEGGING LITHOTOMY PAIR STRL (DRAPES) ×3 IMPLANT
LIGASURE IMPACT 36 18CM CVD LR (INSTRUMENTS) IMPLANT
NDL SAFETY ECLIPSE 18X1.5 (NEEDLE) IMPLANT
NEEDLE HYPO 18GX1.5 SHARP (NEEDLE)
NEEDLE HYPO 22GX1.5 SAFETY (NEEDLE) ×3 IMPLANT
NS IRRIG 1000ML POUR BTL (IV SOLUTION) ×9 IMPLANT
PACK BASIC VI WITH GOWN DISP (CUSTOM PROCEDURE TRAY) ×3 IMPLANT
PENCIL BUTTON HOLSTER BLD 10FT (ELECTRODE) ×6 IMPLANT
RTRCTR WOUND ALEXIS 18CM MED (MISCELLANEOUS)
SCALPEL HARMONIC ACE (MISCELLANEOUS) IMPLANT
SCISSORS LAP 5X35 DISP (ENDOMECHANICALS) ×3 IMPLANT
SEALER TISSUE G2 CVD JAW 35 (ENDOMECHANICALS) IMPLANT
SEALER TISSUE G2 CVD JAW 45CM (ENDOMECHANICALS)
SET IRRIG TUBING LAPAROSCOPIC (IRRIGATION / IRRIGATOR) IMPLANT
SLEEVE ENDOPATH XCEL 5M (ENDOMECHANICALS) ×9 IMPLANT
SPONGE GAUZE 4X4 12PLY (GAUZE/BANDAGES/DRESSINGS) ×3 IMPLANT
SPONGE LAP 18X18 X RAY DECT (DISPOSABLE) IMPLANT
SPONGE SURGIFOAM ABS GEL 12-7 (HEMOSTASIS) IMPLANT
STAPLER CIRC ILS CVD 33MM 37CM (STAPLE) ×3 IMPLANT
STAPLER CUT CVD 40MM BLUE (STAPLE) ×3 IMPLANT
STAPLER VISISTAT 35W (STAPLE) ×3 IMPLANT
SUCTION POOLE TIP (SUCTIONS) ×6 IMPLANT
SUT MNCRL AB 4-0 PS2 18 (SUTURE) ×3 IMPLANT
SUT PDS AB 1 CTX 36 (SUTURE) ×6 IMPLANT
SUT PROLENE 0 CT 2 (SUTURE) ×3 IMPLANT
SUT PROLENE 2 0 CT2 30 (SUTURE) IMPLANT
SUT PROLENE 2 0 KS (SUTURE) IMPLANT
SUT SILK 2 0 (SUTURE) ×1
SUT SILK 2 0 SH CR/8 (SUTURE) ×3 IMPLANT
SUT SILK 2-0 18XBRD TIE 12 (SUTURE) ×2 IMPLANT
SUT SILK 3 0 (SUTURE) ×1
SUT SILK 3 0 SH CR/8 (SUTURE) ×3 IMPLANT
SUT SILK 3-0 18XBRD TIE 12 (SUTURE) ×2 IMPLANT
SUT VIC AB 2-0 SH 18 (SUTURE) ×6 IMPLANT
SUT VIC AB 2-0 SH 27 (SUTURE)
SUT VIC AB 2-0 SH 27X BRD (SUTURE) IMPLANT
SUT VIC AB 3-0 SH 18 (SUTURE) ×6 IMPLANT
SUT VIC AB 4-0 SH 18 (SUTURE) IMPLANT
SYS LAPSCP GELPORT 120MM (MISCELLANEOUS) ×3
SYSTEM LAPSCP GELPORT 120MM (MISCELLANEOUS) ×2 IMPLANT
TAPE UMBILICAL COTTON 1/8X30 (MISCELLANEOUS) ×3 IMPLANT
TOWEL OR 17X26 10 PK STRL BLUE (TOWEL DISPOSABLE) ×6 IMPLANT
TRAY FOLEY CATH 14FRSI W/METER (CATHETERS) ×3 IMPLANT
TRAY LAP CHOLE (CUSTOM PROCEDURE TRAY) ×3 IMPLANT
TROCAR XCEL 12X100 BLDLESS (ENDOMECHANICALS) IMPLANT
TROCAR XCEL BLUNT TIP 100MML (ENDOMECHANICALS) ×3 IMPLANT
TROCAR XCEL NON-BLD 11X100MML (ENDOMECHANICALS) IMPLANT
TROCAR XCEL NON-BLD 5MMX100MML (ENDOMECHANICALS) ×3 IMPLANT
TROCAR Z-THREAD FIOS 5X100MM (TROCAR) ×6 IMPLANT
TUBING FILTER THERMOFLATOR (ELECTROSURGICAL) ×3 IMPLANT
TUNNELER SHEATH ON-Q 16GX12 DP (PAIN MANAGEMENT) ×3 IMPLANT
WATER STERILE IRR 1000ML POUR (IV SOLUTION) ×3 IMPLANT
YANKAUER SUCT BULB TIP 10FT TU (MISCELLANEOUS) ×3 IMPLANT
YANKAUER SUCT BULB TIP NO VENT (SUCTIONS) ×6 IMPLANT

## 2012-10-12 NOTE — Progress Notes (Signed)
Dr Michaell Cowing was notified that patient was not instructed and did not take Flagyl or Neomycin 10/11/12. He also was not instructed to only have clear liquid diet 10/11/12 but he only had clear liquids and yogert 10/11/12. No laxatives were taken

## 2012-10-12 NOTE — Transfer of Care (Signed)
Immediate Anesthesia Transfer of Care Note  Patient: Shawn Mosley  Procedure(s) Performed: Procedure(s) with comments: PROCTOSCOPY (N/A) LAPAROSCOPIC LOW ANTERIOR RESECTION (N/A) - bladder repair  Patient Location: PACU  Anesthesia Type:General  Level of Consciousness: awake and alert   Airway & Oxygen Therapy: Patient Spontanous Breathing and Patient connected to face mask oxygen  Post-op Assessment: Report given to PACU RN and Post -op Vital signs reviewed and stable  Post vital signs: Reviewed and stable  Complications: No apparent anesthesia complications

## 2012-10-12 NOTE — Progress Notes (Signed)
Dr. Council Mechanic made aware of patient's heart rates- urine output, and I.V. Intake in O.R.- TO COMPLETE PRESENET BAG OF i.V. FLUIDS IN Pacu- then may go to floor

## 2012-10-12 NOTE — Anesthesia Postprocedure Evaluation (Signed)
  Anesthesia Post-op Note  Patient: Shawn Mosley  Procedure(s) Performed: Procedure(s) (LRB): PROCTOSCOPY (N/A) LAPAROSCOPIC LOW ANTERIOR RESECTION (N/A)  Patient Location: PACU  Anesthesia Type: General  Level of Consciousness: awake and alert   Airway and Oxygen Therapy: Patient Spontanous Breathing  Post-op Pain: mild  Post-op Assessment: Post-op Vital signs reviewed, Patient's Cardiovascular Status Stable, Respiratory Function Stable, Patent Airway and No signs of Nausea or vomiting  Last Vitals:  Filed Vitals:   10/12/12 1515  BP: 129/72  Pulse: 99  Temp: 36.7 C  Resp: 20    Post-op Vital Signs: stable   Complications: No apparent anesthesia complications. Urine output  Ok in [place].

## 2012-10-12 NOTE — Progress Notes (Signed)
Spoke with pt in regards to nighttime CPAP, pt does wear at home and has brought in home machine/nasal mask/tubing. Set up pt machine and added sterile water for humidification. Informed pt that [facility] will need to check machine. pt at this time prefers to remain on nasal cannula at 2L. VSS. RT will assist as needed.

## 2012-10-12 NOTE — H&P (Signed)
Shawn Mosley  11-08-1970 [id number]   This patient is a 42 y.o.male who presents today for surgical evaluation   This patient is a 42 y.o.male who presents today for surgical evaluation to reconsider colectomy for diverticulitis.  Pleasant obese male. Developed severe diverticulitis with perforation and abscess. Required laparoscopic washout and drain. Developed a late delayed fistula. Eventually closed a month later. Was considering elective colon resection. However developed pulmonary emboli and required full anticoagulation. He has now completed a year treatment and wishes to reconsider surgery. He never obtained a colonoscopy due to concerns of proceeding on full anticoagulation.  Has two soft bowel movements a day. Not in any fiber. Does get some intermittent episodes of crampy abdominal pain. Usually periumbilical. Not postprandial. No nausea or vomiting. No fevers or chills. Does not radiate to his back or side. No changes in bowel frequency. Energy level has been rather good.  Colonoscopy now done.  Off warfarin.  No new events   Past Medical History  Diagnosis Date  . Diverticulitis 2012  . Fistula     Diverticular abscess with fistula.  Resolved August 2012  . Obesity (BMI 30-39.9)   . PE (pulmonary thromboembolism) 2012-2013    Resolved with warfarin x1 year  . Sleep apnea     Cpap    Past Surgical History  Procedure Laterality Date  . Colectomy  12/10/10    Diverticular Abscess   . Colonoscopy  3 weeks ago    History   Social History  . Marital Status: Divorced    Spouse Name: N/A    Number of Children: 3  . Years of Education: N/A   Occupational History  . [employer]     Social History Main Topics  . Smoking status: Never Smoker   . Smokeless tobacco: Never Used  . Alcohol Use: 0.0 oz/week     Comment: occasional  . Drug Use: No  . Sexually Active: Not on file   Other Topics Concern  . Not on file   Social History Narrative   2 caffeine drinks daily      Family History  Problem Relation Age of Onset  . Liver cancer Father   . Colon cancer Neg Hx     Current Facility-Administered Medications  Medication Dose Route Frequency Provider Last Rate Last Dose  . bupivacaine 0.25 % ON-Q pump DUAL CATH 300 mL  300 mL Other Continuous Ardeth Sportsman, MD      . cefoTEtan (CEFOTAN) 2 g in dextrose 5 % 50 mL IVPB  2 g Intravenous To OR Ardeth Sportsman, MD      . lactated ringers infusion   Intravenous Continuous Gaetano Hawthorne, MD 100 mL/hr at 10/12/12 1003 1,000 mL at 10/12/12 1003     No Known Allergies    BP 136/84  Pulse 96  Temp(Src) 98 F (36.7 C)  Resp 20  SpO2 99%   Results:   Labs: Results for orders placed during the hospital encounter of 10/12/12 (from the past 48 hour(s))  ABO/RH     Status: None   Collection Time    10/12/12  9:30 AM      Result Value Range   ABO/RH(D) A POS    TYPE AND SCREEN     Status: None   Collection Time    10/12/12  9:35 AM      Result Value Range   ABO/RH(D) A POS     Antibody Screen PENDING     Sample Expiration  10/15/2012      Imaging / Studies: No results found.  Medications / Allergies: per chart  Antibiotics: Anti-infectives   Start     Dose/Rate Route Frequency Ordered Stop   10/12/12 0900  cefoTEtan (CEFOTAN) 2 g in dextrose 5 % 50 mL IVPB     2 g 100 mL/hr over 30 Minutes Intravenous To Surgery 10/12/12 0850 10/13/12 0900   10/12/12 0850  metroNIDAZOLE (FLAGYL) tablet 500 mg  Status:  Discontinued    Comments:  Take 2 pills (=1000mg ) by mouth at 1pm, 3pm, and 10pm the day before your colorectal operation   500 mg Oral As directed 10/12/12 0850 10/12/12 0858   10/12/12 0850  neomycin (MYCIFRADIN) tablet 500 mg  Status:  Discontinued     500 mg Oral As directed 10/12/12 0850 10/12/12 0858     Colonoscopy with sigmoid stricture & inflammation Feb2014:coloINAL DIAGNOSIS Diagnosis Surgical [P], proximal rectum, bx - BENIGN RECTAL MUCOSA WITH PROLAPSE TYPE INJURY, SEE  COMMENT. - NEGATIVE FOR DYSPLASIA. Microscopic Comment The endoscopic impression of abnormal mucosa is noted. The differential considerations include mucosal prolapse injury, not otherwise specified and mucosal prolapse/solitary rectal ulcer syndrome. A history of ulcers would support the latter. (CRR:gt, 09/26/12) [place] RUND DO Pathologist, Electronic Signature  Review of Systems  Constitutional: Negative for fever, chills and diaphoresis.  HENT: Negative for nosebleeds, sore throat, facial swelling, mouth sores, trouble swallowing and ear discharge.  Eyes: Negative for photophobia, discharge and visual disturbance.  Respiratory: Negative for choking, chest tightness, shortness of breath and stridor.  Cardiovascular: Negative for chest pain and palpitations.  Gastrointestinal: Negative for nausea, vomiting, abdominal pain, diarrhea, constipation, blood in stool, abdominal distention, anal bleeding and rectal pain.  Genitourinary: Negative for dysuria, urgency, difficulty urinating and testicular pain.  Musculoskeletal: Negative for myalgias, back pain, arthralgias and gait problem.  Skin: Negative for color change, pallor, rash and wound.  Neurological: Negative for dizziness, speech difficulty, weakness, numbness and headaches.  Hematological: Negative for adenopathy. Does not bruise/bleed easily.  Psychiatric/Behavioral: Negative for hallucinations, confusion and agitation.  Objective:   Physical Exam  Constitutional: He is oriented to person, place, and time. He appears well-developed and well-nourished. No distress.  HENT:  Head: Normocephalic.  Mouth/Throat: Oropharynx is clear and moist. No oropharyngeal exudate.  Eyes: Conjunctivae normal and EOM are normal. Pupils are equal, round, and reactive to light. No scleral icterus.  Neck: Normal range of motion. Neck supple. No tracheal deviation present.  Cardiovascular: Normal rate, regular rhythm and intact distal pulses.   Pulmonary/Chest: Effort normal and breath sounds normal. No respiratory distress.  Abdominal: Soft. He exhibits no distension. There is no tenderness. There is no rigidity, no guarding, no tenderness at McBurney's point and negative Murphy's sign. No hernia. Hernia confirmed negative in the right inguinal area and confirmed negative in the left inguinal area.  Obese but soft.  Musculoskeletal: Normal range of motion. He exhibits no tenderness.  Lymphadenopathy:  He has no cervical adenopathy.  Right: No inguinal adenopathy present.  Left: No inguinal adenopathy present.  Neurological: He is alert and oriented to person, place, and time. No cranial nerve deficit. He exhibits normal muscle tone. Coordination normal.  Skin: Skin is warm and dry. No rash noted. He is not diaphoretic. No erythema. No pallor.  Psychiatric: He has a normal mood and affect. His behavior is normal. Judgment and thought content normal.  Assessment:   Severe episode of diverticulitis with perforation/abscess and delayed fistula formation summer 2012. Need for sigmoid  colectomy.   Pulmonary embolism requiring full anticoagulation, now resolved.   Plan:   Given that he is only in his 72s and had a severe attack requiring emergency surgery with abscess and fistula formation, I think he is at reasonably high risk in his lifetime for getting recurrent diverticulitis. I still recommend elective surgery. I still recommend consideration of a colonoscopy preoperatively. He is interested in doing this. I discussed the procedure with him:  The anatomy & physiology of the digestive tract was discussed. The pathophysiology was discussed. Natural history risks without surgery was discussed. I feel the risks of no intervention will lead to serious problems that outweigh the operative risks; therefore, I recommended a partial colectomy to remove the pathology. Laparoscopic & open techniques were discussed.  Risks such as bleeding,  infection, abscess, leak, reoperation, possible ostomy, hernia, heart attack, death, and other risks were discussed. I noted a good likelihood this will help address the problem. Goals of post-operative recovery were discussed as well. We will work to minimize complications. An educational handout on the pathology was given as well. Questions were answered. The patient expresses understanding & wishes to proceed with surgery.        Ardeth Sportsman, M.D., F.A.C.S. Gastrointestinal and Minimally Invasive Surgery [facility] [address] [place], [place] [address] [phone] Main / Paging [phone] Voice Mail   10/12/2012  CARE TEAM:  PCP: Default, Provider, MD  Outpatient Care Team: Patient Care Team: Provider Default, MD as PCP - General Rachael Fee, MD as Consulting Physician (Gastroenterology)  Inpatient Treatment Team: Treatment Team: Attending Provider: Ardeth Sportsman, MD

## 2012-10-12 NOTE — Op Note (Signed)
10/12/2012  2:12 PM  PATIENT:  Shawn Mosley  42 y.o. 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 Cornett, MD - Asst  ANESTHESIA:   local and general  EBL:  Total I/O In: 2000 [I.V.:2000] Out: 200 [Urine:200]  Delay start of Pharmacological VTE agent (>24hrs) due to surgical blood loss or risk of bleeding:  no  DRAINS: On-Q continuous bupivacaine pump in preperitoneal space.   SPECIMEN:  Source of Specimen:  rectosigmoid colon.  Opened end proximal.  Blue stitch in prioximal anastomotic ring.  DISPOSITION OF SPECIMEN:  PATHOLOGY  COUNTS:  YES  PLAN OF CARE: Admit to inpatient   PATIENT DISPOSITION:  PACU - hemodynamically stable.  INDICATION:    Obese male with severe perforated diverticulitis status post washout.  Delayed fistula formation.  Gradually resolve.  Pulmonary embolism requiring warfarin.  Resolved.  Request removal given history of severe attack.  I recommended segmental resection:  The anatomy & physiology of the digestive tract was discussed.  The pathophysiology was discussed.  Natural history risks without surgery was discussed.   I worked to give an overview of the disease and the frequent need to have multispecialty involvement.  I feel the risks of no intervention will lead to serious problems that outweigh the operative risks; therefore, I recommended a partial colectomy to remove the pathology.  Laparoscopic & open techniques were discussed.   Risks such as bleeding, infection, abscess, leak, reoperation, possible ostomy, hernia, heart attack, death, and other  risks were discussed.  I noted a good likelihood this will help address the problem.   Goals of post-operative recovery were discussed as well.  We will work to minimize complications.  An educational handout on the pathology was given as well.  Questions were answered.    The patient expresses understanding & wishes to proceed with surgery.  OR FINDINGS:   Patient had Moderate adhesions of omentum to the lower abdomen.  He had a very dense and fibrotic mid to distal sigmoid colon.  He adhesions the obliterating into the dome of the bladder.  Suspicious vertical vesicular fistula.  No obvious metastatic disease on visceral parietal peritoneum or liver.  The anastomosis rests 14 cm from the anal verge by rigid proctoscopy.  DESCRIPTION:   Informed consent was confirmed.  The patient underwent general anaesthesia without difficulty.  The patient was positioned appropriately.  VTE prevention in place.  The patient's abdomen was clipped, prepped, & draped in a sterile fashion.  Surgical timeout confirmed our plan.  The patient was positioned in reverse Trendelenburg.  Abdominal entry was gained using optical entry technique in the right upper abdomen.  Entry was clean.  I induced carbon dioxide insufflation.  Camera inspection revealed no injury.  Extra ports were carefully placed under direct laparoscopic visualization.  I released of adhesions of omentum and small bowel to the anterior abdominal wall and the rectosigmoid colon.  We reflected the greater omentum and the upper abdomen the small bowel in the upper abdomen. I scored the base of peritoneum of the right side of the mesentery of the left colon from the ligament of Treitz to the peritoneal rectal reflection.   I elevated  the sigmoid mesentery and got into the retro-mesenteric plane. We were able to identify the left ureter and gonadal vessels. We kept those posterior within the retroperitoneum and elevated the left colon mesentery off that.  I did free off of mesentery to the IMA pedicle but did not ligate it yet.  I continued distally and got into the avascular plane posterior to the mesorectum. This allowed me to help mobilize the rectum as well by freeing the mesorectum off the sacrum.I mobilized the peritoneal coverings to the peritoneal reflection on both the right and left sides of the rectum.  I could see the right and left ureters and stayed away from the house.  I kept in the lateral vascular pedicles to the rectum intact.  I skeletonized the lymph nodes off the inferior mesenteric artery pedicle.  I went down to its takeoff from the aorta.  I isolated the inferior mesenteric vein off of the ligament of Treitz just cephalad to that as well.  After confirming the left ureter was out of the way, I went ahead and ligated the inferior mesenteric artery pedicle with bipolar EnSeal just near its takeoff from the aorta.  I did ligate the inferior mesenteric vein in a similar fashion.  We ensured hemostasis. I skeletonized the mesorectum at the junction at the proximal rectum using blunt dissection & bipolar EnSeal.    There were density lesions between the rectosigmoid to the bladder and the right anterior pelvic wall.  Alternate with some careful blunt hydrodissection And sharp dissection.  In getting there we noticed entering the bladder.  I placed a GelPort has a wound protector through a Pfannenstiel incision in the suprapubic region.  We further freed the rectosigmoid off the bladder.  A closed the bladder opening in two layers using 3-0 Vicryl mucosal and 2-0 Vicryl full-thickness interrupted stitches.    Because of fibrosis and thickening at the rectosigmoid junction went more distally down towards the mid rectum.  I transected bowel at the junction between the proximal and mid rectum using a contour stapler. I was able to eviscerate the rectosigmoid and descending colon out the wound.   Dr. Christella Hartigan' tattooing was seen proximal to the  distal staple line.  I chose a region at the descending/sigmoid junction that was soft and easily reached down. I clamped the colon proximal to this area using a soft bowel clamp. I transected at the descending/sigmoid junction with a scalpel. I got healthy bleeding mucosa. I transected the remaining specimen mesentery in a radial fashion to preserve good blood supply to the proximal colon end.  We sent the rectosigmoid colon specimen off to go to pathology.  We sized the colon orifice.  I chose a 33 EEA anvil stapler system. I placed the anvil to the open end of the descending colon and closed around it using a 0 Prolene pursestring.  We did copious irrigation with crystalloid solution.  Hemostasis was good.      Dr. Luisa Hart scrubbed down and did gentle anal dilation and advanced the EEA stapler up the rectal stump. The spike was brought out at the provimal end of the rectal stump under direct visualization.  Dr. Luisa Hart attached the anvil of the proximal colon the spike of the stapler. Anvil was tightened down and held clamped for 60 seconds. The EEA stapler was fired and held clamped for 30 seconds. The stapler was released & removed. We noted 2 excellent anastomotic rings. Blue stitch is in the proximal ring.  He did  rigid proctoscopy noted the anastomosis was at 14 cm from the anal verge consistent with the proximal rectum.  We did a final irrigation of antibiotic solution (900 mg clindamycin/240 mg gentamicin in a liter of crystalloid) & held that for the pelvic air leak test.  The rectum was insufflated the rectum while clamping the colon proximal to that anastomosis.  There was a negative air leak test. There was no tension.   Anastomosis & colon looked viable.    We changed gown and gloves.  We aspirated the antibiotic irrigation.  We inspected the abdomen pelvic cavity.  Hemostasis was good.   Ureters & bowel uninjured.  The anastomosis looked healthy.  I placed On-Q catheter and sheaths into the  preperitoneal space under direct palpation.  I removed CO2 gas out through the ports.  I closed the 5mm port sites using Monocryl stitch and sterile dressing.  Closed the Pfannenstiel wound using a 0 Vicryl vertical peritoneal closure and a #1 PDS transverse anterior rectal fascial closure. I closed the skin with some interrupted Monocryl stitches. I placed soaked wicks in between those areas. I placed sterile dressing.  OnQ catheters placed & sheaths peeled away.  Patient is being extubated go to recovery room. I discussed postop care with the patient in detail the office & in the holding area. Instructions are written. I'm about to locate family and discuss it with them as well.

## 2012-10-12 NOTE — Anesthesia Preprocedure Evaluation (Addendum)
Anesthesia Evaluation  Patient identified by MRN, date of birth, ID band Patient awake    Reviewed: Allergy & Precautions, H&P , NPO status , Patient's Chart, lab work & pertinent test results  Airway Mallampati: II TM Distance: >3 FB Neck ROM: Full    Dental no notable dental hx.    Pulmonary sleep apnea and Continuous Positive Airway Pressure Ventilation , PE H/O PE 2012-13 breath sounds clear to auscultation  Pulmonary exam normal       Cardiovascular negative cardio ROS  Rhythm:Regular Rate:Normal     Neuro/Psych negative neurological ROS  negative psych ROS   GI/Hepatic negative GI ROS, Neg liver ROS,   Endo/Other  negative endocrine ROS  Renal/GU negative Renal ROS  negative genitourinary   Musculoskeletal negative musculoskeletal ROS (+)   Abdominal (+) + obese,   Peds negative pediatric ROS (+)  Hematology negative hematology ROS (+)   Anesthesia Other Findings   Reproductive/Obstetrics negative OB ROS                           Anesthesia Physical Anesthesia Plan  ASA: II  Anesthesia Plan: General   Post-op Pain Management:    Induction: Intravenous  Airway Management Planned: Oral ETT  Additional Equipment:   Intra-op Plan:   Post-operative Plan: Extubation in OR  Informed Consent: I have reviewed the patients History and Physical, chart, labs and discussed the procedure including the risks, benefits and alternatives for the proposed anesthesia with the patient or authorized representative who has indicated his/her understanding and acceptance.   Dental advisory given  Plan Discussed with: CRNA  Anesthesia Plan Comments: (Chipped left upper rear crown/molar during or after previous anesthetic.)       Anesthesia Quick Evaluation

## 2012-10-13 DIAGNOSIS — Z9889 Other specified postprocedural states: Secondary | ICD-10-CM

## 2012-10-13 LAB — BASIC METABOLIC PANEL
BUN: 11 mg/dL (ref 6–23)
CO2: 28 mEq/L (ref 19–32)
Calcium: 8.6 mg/dL (ref 8.4–10.5)
Chloride: 97 mEq/L (ref 96–112)
Creatinine, Ser: 1.49 mg/dL — ABNORMAL HIGH (ref 0.50–1.35)
GFR calc Af Amer: 64 mL/min — ABNORMAL LOW (ref >90)
GFR calc non Af Amer: 55 mL/min — ABNORMAL LOW (ref >90)
Glucose, Bld: 139 mg/dL — ABNORMAL HIGH (ref 70–99)
Potassium: 4.4 mEq/L (ref 3.5–5.1)
Sodium: 134 mEq/L — ABNORMAL LOW (ref 135–145)

## 2012-10-13 LAB — CBC
HCT: 37 % — ABNORMAL LOW (ref 39.0–52.0)
Hemoglobin: 12.4 g/dL — ABNORMAL LOW (ref 13.0–17.0)
MCH: 29.3 pg (ref 26.0–34.0)
MCHC: 33.5 g/dL (ref 30.0–36.0)
MCV: 87.5 fL (ref 78.0–100.0)
Platelets: 324 10*3/uL (ref 150–400)
RBC: 4.23 MIL/uL (ref 4.22–5.81)
RDW: 13.5 % (ref 11.5–15.5)
WBC: 13.6 10*3/uL — ABNORMAL HIGH (ref 4.0–10.5)

## 2012-10-13 LAB — MAGNESIUM: Magnesium: 1.9 mg/dL (ref 1.5–2.5)

## 2012-10-13 MED ORDER — SODIUM CHLORIDE 0.9 % IJ SOLN
3.0000 mL | Freq: Two times a day (BID) | INTRAMUSCULAR | Status: DC
  Administered 2012-10-13 (×2): 3 mL via INTRAVENOUS

## 2012-10-13 MED ORDER — SODIUM CHLORIDE 0.9 % IJ SOLN: 3.0000 mL | INTRAMUSCULAR | Status: DC | PRN

## 2012-10-13 MED ORDER — LACTATED RINGERS IV BOLUS (SEPSIS): 1000.0000 mL | Freq: Three times a day (TID) | INTRAVENOUS | Status: AC | PRN

## 2012-10-13 MED ORDER — ACETAMINOPHEN 500 MG PO TABS
1000.0000 mg | ORAL_TABLET | Freq: Four times a day (QID) | ORAL | Status: DC
  Administered 2012-10-13 – 2012-10-15 (×9): 1000 mg via ORAL
  Filled 2012-10-13 (×14): qty 2

## 2012-10-13 NOTE — Progress Notes (Signed)
RT checked with patient for CPAP. His home machine, tubing and mask is set up. Sterile water already to fill line. Patient states he does not require any assistance but RT encouraged him to call if he should need any help throughout the night.

## 2012-10-13 NOTE — Progress Notes (Signed)
Shawn Mosley [id number] 08-12-1970   Subjective:  Feels well Not too sore No N/V Tolerating clears  Objective:  Vital signs:  Filed Vitals:   10/12/12 1915 10/12/12 2209 10/13/12 0200 10/13/12 0550  BP: 117/70 138/70 123/72 108/63  Pulse: 93 89 86 81  Temp: 98.9 F (37.2 C) 98.6 F (37 C) 97.8 F (36.6 C) 98.9 F (37.2 C)  TempSrc: Oral Oral Oral Oral  Resp: 16 16 16 16   Height:      Weight:      SpO2: 99% 99% 99% 99%    Last BM Date: 10/12/12  Intake/Output   Yesterday:  03/06 0701 - 03/07 0700 In: 3949.2 [P.O.:240; I.V.:3709.2] Out: 1150 [Urine:1050; Blood:100] This shift:     Bowel function:  Flatus: n  BM: n  Physical Exam:  General: Pt awake/alert/oriented x4 in no acute distress Eyes: PERRL, normal EOM.  Sclera clear.  No icterus Neuro: CN II-XII intact w/o focal sensory/motor deficits. Lymph: No head/neck/groin lymphadenopathy Psych:  No delerium/psychosis/paranoia HENT: Normocephalic, Mucus membranes moist.  No thrush Neck: Supple, No tracheal deviation Chest: No chest wall pain w good excursion CV:  Pulses intact.  Regular rhythm MS: Normal AROM mjr joints.  No obvious deformity Abdomen: Soft.  Nondistended.  Mildly tender at incisions only.  No incarcerated hernias.  OnQ in place.   Dressings dry Ext:  SCDs BLE.  No mjr edema.  No cyanosis Skin: No petechiae / purpurae  Problem List:  Principal Problem:   Diverticulitis of large intestine with perforation Active Problems:   Obesity (BMI 30-39.9)   Status post bladder repair   Assessment  Shawn Mosley  42 y.o. male  1 Day Post-Op  Procedure(s): PROCTOSCOPY LAPAROSCOPIC LOW ANTERIOR RESECTION  Recovering well  Plan:  -ADAT -inc Cr - hold NSAIDs.  Follow.  Good UOP = prob will resolve -f/u path -pain control -VTE prophylaxis- SCDs, heparin, etc -mobilize as tolerated to help recovery  Ardeth Sportsman, M.D., F.A.C.S. Gastrointestinal and Minimally Invasive  Surgery [facility] [address], [address] [place], [place] [address] [phone] Main / Paging [phone] Voice Mail   10/13/2012  CARE TEAM:  PCP: Default, Provider, MD  Outpatient Care Team: Patient Care Team: Provider Default, MD as PCP - General Rachael Fee, MD as Consulting Physician (Gastroenterology)  Inpatient Treatment Team: Treatment Team: Attending Provider: Ardeth Sportsman, MD; Registered Nurse: Horton Marshall   Results:   Labs: Results for orders placed during the hospital encounter of 10/12/12 (from the past 48 hour(s))  ABO/RH     Status: None   Collection Time    10/12/12  9:30 AM      Result Value Range   ABO/RH(D) A POS    TYPE AND SCREEN     Status: None   Collection Time    10/12/12  9:35 AM      Result Value Range   ABO/RH(D) A POS     Antibody Screen NEG     Sample Expiration 10/15/2012    CBC     Status: Abnormal   Collection Time    10/12/12  4:58 PM      Result Value Range   WBC 20.3 (*) 4.0 - 10.5 K/uL   RBC 4.52  4.22 - 5.81 MIL/uL   Hemoglobin 13.5  13.0 - 17.0 g/dL   HCT 95.2  84.1 - 32.4 %   MCV 87.8  78.0 - 100.0 fL   MCH 29.9  26.0 - 34.0 pg   MCHC 34.0  30.0 - 36.0 g/dL   RDW 16.1  09.6 - 04.5 %   Platelets 367  150 - 400 K/uL  CREATININE, SERUM     Status: Abnormal   Collection Time    10/12/12  4:58 PM      Result Value Range   Creatinine, Ser 1.10  0.50 - 1.35 mg/dL   GFR calc non Af Amer 80 (*) >90 mL/min   GFR calc Af Amer >90  >90 mL/min   Comment:            The eGFR has been calculated     using the CKD EPI equation.     This calculation has not been     validated in all clinical     situations.     eGFR's persistently     <90 mL/min signify     possible Chronic Kidney Disease.  BASIC METABOLIC PANEL     Status: Abnormal   Collection Time    10/13/12  4:14 AM      Result Value Range   Sodium 134 (*) 135 - 145 mEq/L   Potassium 4.4  3.5 - 5.1 mEq/L   Chloride 97  96 - 112  mEq/L   CO2 28  19 - 32 mEq/L   Glucose, Bld 139 (*) 70 - 99 mg/dL   BUN 11  6 - 23 mg/dL   Creatinine, Ser 4.09 (*) 0.50 - 1.35 mg/dL   Calcium 8.6  8.4 - 81.1 mg/dL   GFR calc non Af Amer 55 (*) >90 mL/min   GFR calc Af Amer 64 (*) >90 mL/min   Comment:            The eGFR has been calculated     using the CKD EPI equation.     This calculation has not been     validated in all clinical     situations.     eGFR's persistently     <90 mL/min signify     possible Chronic Kidney Disease.  CBC     Status: Abnormal   Collection Time    10/13/12  4:14 AM      Result Value Range   WBC 13.6 (*) 4.0 - 10.5 K/uL   RBC 4.23  4.22 - 5.81 MIL/uL   Hemoglobin 12.4 (*) 13.0 - 17.0 g/dL   HCT 91.4 (*) 78.2 - 95.6 %   MCV 87.5  78.0 - 100.0 fL   MCH 29.3  26.0 - 34.0 pg   MCHC 33.5  30.0 - 36.0 g/dL   RDW 21.3  08.6 - 57.8 %   Platelets 324  150 - 400 K/uL  MAGNESIUM     Status: None   Collection Time    10/13/12  4:14 AM      Result Value Range   Magnesium 1.9  1.5 - 2.5 mg/dL    Imaging / Studies: No results found.  Medications / Allergies: per chart  Antibiotics: Anti-infectives   Start     Dose/Rate Route Frequency Ordered Stop   10/12/12 2200  cefoTEtan (CEFOTAN) 2 g in dextrose 5 % 50 mL IVPB     2 g 100 mL/hr over 30 Minutes Intravenous Every 12 hours 10/12/12 1645 10/12/12 2132   10/12/12 1215  clindamycin (CLEOCIN) 900 mg, gentamicin (GARAMYCIN) 240 mg in sodium chloride 0.9 % 1,000 mL for intraperitoneal lavage      Intraperitoneal To Surgery 10/12/12 1159 10/12/12  1351   10/12/12 0900  cefoTEtan (CEFOTAN) 2 g in dextrose 5 % 50 mL IVPB     2 g 100 mL/hr over 30 Minutes Intravenous To Surgery 10/12/12 0850 10/12/12 1107   10/12/12 0850  metroNIDAZOLE (FLAGYL) tablet 500 mg  Status:  Discontinued    Comments:  Take 2 pills (=1000mg ) by mouth at 1pm, 3pm, and 10pm the day before your colorectal operation   500 mg Oral As directed 10/12/12 0850 10/12/12 0858   10/12/12  0850  neomycin (MYCIFRADIN) tablet 500 mg  Status:  Discontinued     500 mg Oral As directed 10/12/12 0850 10/12/12 1610

## 2012-10-14 LAB — CREATININE, SERUM
Creatinine, Ser: 1.59 mg/dL — ABNORMAL HIGH (ref 0.50–1.35)
GFR calc Af Amer: 59 mL/min — ABNORMAL LOW (ref >90)
GFR calc non Af Amer: 51 mL/min — ABNORMAL LOW (ref >90)

## 2012-10-14 LAB — POTASSIUM: Potassium: 4.5 mEq/L (ref 3.5–5.1)

## 2012-10-14 LAB — HEMOGLOBIN: Hemoglobin: 12.1 g/dL — ABNORMAL LOW (ref 13.0–17.0)

## 2012-10-14 MED ORDER — SODIUM CHLORIDE 0.9 % IV SOLN
INTRAVENOUS | Status: DC
  Administered 2012-10-14 (×2): via INTRAVENOUS

## 2012-10-14 MED ORDER — SODIUM CHLORIDE 0.9 % IV BOLUS (SEPSIS)
1000.0000 mL | Freq: Once | INTRAVENOUS | Status: AC
  Administered 2012-10-14: 1000 mL via INTRAVENOUS

## 2012-10-14 NOTE — Progress Notes (Signed)
Shawn Mosley [id number] [birth year]   Subjective:  Feels OK Not too sore No N/V Tolerating fulls Feels rumblings but no flatus yet  Objective:  Vital signs:  Filed Vitals:   10/13/12 1039 10/13/12 1400 10/13/12 2200 10/14/12 0644  BP: 102/68 98/55 106/70 112/74  Pulse: 88 93 84 94  Temp: 98.8 F (37.1 C) 99.4 F (37.4 C) 98.9 F (37.2 C) 98.1 F (36.7 C)  TempSrc: Oral Oral Oral Oral  Resp: 16 15 18 18   Height:      Weight:      SpO2: 94% 96% 92% 94%    Last BM Date: 10/12/12  Intake/Output   Yesterday:  03/07 0701 - 03/08 0700 In: 807.5 [P.O.:720; I.V.:87.5] Out: 750 [Urine:750] This shift:  Total I/O In: -  Out: 325 [Urine:325]  Bowel function:  Flatus: n  BM: n  Physical Exam:  General: Pt awake/alert/oriented x4 in no acute distress Eyes: PERRL, normal EOM.  Sclera clear.  No icterus Neuro: CN II-XII intact w/o focal sensory/motor deficits. Lymph: No head/neck/groin lymphadenopathy Psych:  No delerium/psychosis/paranoia HENT: Normocephalic, Mucus membranes moist.  No thrush Neck: Supple, No tracheal deviation Chest: No chest wall pain w good excursion CV:  Pulses intact.  Regular rhythm MS: Normal AROM mjr joints.  No obvious deformity Abdomen: Soft.  Obese.  Nondistended.  Mnimally tender at incisions only.  No incarcerated hernias.  OnQ in place.   Dressings dry Ext:  SCDs BLE.  No mjr edema.  No cyanosis Skin: No petechiae / purpurae  Problem List:  Principal Problem:   Diverticulitis of large intestine with perforation Active Problems:   Obesity (BMI 30-39.9)   Status post bladder repair   Assessment  Shawn Mosley  42 y.o. male  2 Days Post-Op  Procedure(s): PROCTOSCOPY LAPAROSCOPIC LOW ANTERIOR RESECTION  Recovering OK  Plan:  -ADAT -inc Cr - IVF bolus & increase IVF.  Follow.  Good UOP -f/u path -pain control -VTE prophylaxis- SCDs, heparin, etc -mobilize as tolerated to help recovery  Ardeth Sportsman, M.D.,  F.A.C.S. Gastrointestinal and Minimally Invasive Surgery [facility] [address] [place], [place] [address] [phone] Main / Paging [phone] Voice Mail   10/14/2012  CARE TEAM:  PCP: Arieon Corcoran, MD  Outpatient Care Team: Patient Care Team: Chiyeko Ferre, MD as PCP - General Rachael Fee, MD as Consulting Physician (Gastroenterology)  Inpatient Treatment Team: Treatment Team: Attending Vercie Pokorny: Ardeth Sportsman, MD; Registered Nurse: Horton Marshall; Registered Nurse: Leonie Douglas, RN; Registered Nurse: Adele Barthel, RN   Results:   Labs: Results for orders placed during the hospital encounter of 10/12/12 (from the past 48 hour(s))  ABO/RH     Status: None   Collection Time    10/12/12  9:30 AM      Result Value Range   ABO/RH(D) A POS    TYPE AND SCREEN     Status: None   Collection Time    10/12/12  9:35 AM      Result Value Range   ABO/RH(D) A POS     Antibody Screen NEG     Sample Expiration 10/15/2012    CBC     Status: Abnormal   Collection Time    10/12/12  4:58 PM      Result Value Range   WBC 20.3 (*) 4.0 - 10.5 K/uL   RBC 4.52  4.22 - 5.81 MIL/uL   Hemoglobin 13.5  13.0 - 17.0 g/dL  HCT 39.7  39.0 - 52.0 %   MCV 87.8  78.0 - 100.0 fL   MCH 29.9  26.0 - 34.0 pg   MCHC 34.0  30.0 - 36.0 g/dL   RDW 54.0  98.1 - 19.1 %   Platelets 367  150 - 400 K/uL  CREATININE, SERUM     Status: Abnormal   Collection Time    10/12/12  4:58 PM      Result Value Range   Creatinine, Ser 1.10  0.50 - 1.35 mg/dL   GFR calc non Af Amer 80 (*) >90 mL/min   GFR calc Af Amer >90  >90 mL/min   Comment:            The eGFR has been calculated     using the CKD EPI equation.     This calculation has not been     validated in all clinical     situations.     eGFR's persistently     <90 mL/min signify     possible Chronic Kidney Disease.  BASIC METABOLIC PANEL     Status: Abnormal   Collection Time     10/13/12  4:14 AM      Result Value Range   Sodium 134 (*) 135 - 145 mEq/L   Potassium 4.4  3.5 - 5.1 mEq/L   Chloride 97  96 - 112 mEq/L   CO2 28  19 - 32 mEq/L   Glucose, Bld 139 (*) 70 - 99 mg/dL   BUN 11  6 - 23 mg/dL   Creatinine, Ser 4.78 (*) 0.50 - 1.35 mg/dL   Calcium 8.6  8.4 - 29.5 mg/dL   GFR calc non Af Amer 55 (*) >90 mL/min   GFR calc Af Amer 64 (*) >90 mL/min   Comment:            The eGFR has been calculated     using the CKD EPI equation.     This calculation has not been     validated in all clinical     situations.     eGFR's persistently     <90 mL/min signify     possible Chronic Kidney Disease.  CBC     Status: Abnormal   Collection Time    10/13/12  4:14 AM      Result Value Range   WBC 13.6 (*) 4.0 - 10.5 K/uL   RBC 4.23  4.22 - 5.81 MIL/uL   Hemoglobin 12.4 (*) 13.0 - 17.0 g/dL   HCT 62.1 (*) 30.8 - 65.7 %   MCV 87.5  78.0 - 100.0 fL   MCH 29.3  26.0 - 34.0 pg   MCHC 33.5  30.0 - 36.0 g/dL   RDW 84.6  96.2 - 95.2 %   Platelets 324  150 - 400 K/uL  MAGNESIUM     Status: None   Collection Time    10/13/12  4:14 AM      Result Value Range   Magnesium 1.9  1.5 - 2.5 mg/dL  HEMOGLOBIN     Status: Abnormal   Collection Time    10/14/12  4:27 AM      Result Value Range   Hemoglobin 12.1 (*) 13.0 - 17.0 g/dL  POTASSIUM     Status: None   Collection Time    10/14/12  4:27 AM      Result Value Range   Potassium 4.5  3.5 - 5.1 mEq/L  CREATININE, SERUM  Status: Abnormal   Collection Time    10/14/12  4:27 AM      Result Value Range   Creatinine, Ser 1.59 (*) 0.50 - 1.35 mg/dL   GFR calc non Af Amer 51 (*) >90 mL/min   GFR calc Af Amer 59 (*) >90 mL/min   Comment:            The eGFR has been calculated     using the CKD EPI equation.     This calculation has not been     validated in all clinical     situations.     eGFR's persistently     <90 mL/min signify     possible Chronic Kidney Disease.    Imaging / Studies: No results  found.  Medications / Allergies: per chart  Antibiotics: Anti-infectives   Start     Dose/Rate Route Frequency Ordered Stop   10/12/12 2200  cefoTEtan (CEFOTAN) 2 g in dextrose 5 % 50 mL IVPB     2 g 100 mL/hr over 30 Minutes Intravenous Every 12 hours 10/12/12 1645 10/12/12 2132   10/12/12 1215  clindamycin (CLEOCIN) 900 mg, gentamicin (GARAMYCIN) 240 mg in sodium chloride 0.9 % 1,000 mL for intraperitoneal lavage      Intraperitoneal To Surgery 10/12/12 1159 10/12/12 1351   10/12/12 0900  cefoTEtan (CEFOTAN) 2 g in dextrose 5 % 50 mL IVPB     2 g 100 mL/hr over 30 Minutes Intravenous To Surgery 10/12/12 0850 10/12/12 1107   10/12/12 0850  metroNIDAZOLE (FLAGYL) tablet 500 mg  Status:  Discontinued    Comments:  Take 2 pills (=1000mg ) by mouth at 1pm, 3pm, and 10pm the day before your colorectal operation   500 mg Oral As directed 10/12/12 0850 10/12/12 0858   10/12/12 0850  neomycin (MYCIFRADIN) tablet 500 mg  Status:  Discontinued     500 mg Oral As directed 10/12/12 0850 10/12/12 1610

## 2012-10-14 NOTE — Plan of Care (Signed)
Problem: Phase II Progression Outcomes Goal: Foley discontinued Outcome: Not Applicable Date Met:  10/14/12 Foley to stay in until d/c order

## 2012-10-15 LAB — CREATININE, SERUM
Creatinine, Ser: 1.39 mg/dL — ABNORMAL HIGH (ref 0.50–1.35)
GFR calc Af Amer: 70 mL/min — ABNORMAL LOW (ref >90)
GFR calc non Af Amer: 60 mL/min — ABNORMAL LOW (ref >90)

## 2012-10-15 LAB — HEMOGLOBIN: Hemoglobin: 11.8 g/dL — ABNORMAL LOW (ref 13.0–17.0)

## 2012-10-15 LAB — POTASSIUM: Potassium: 4.2 mEq/L (ref 3.5–5.1)

## 2012-10-15 MED ORDER — OXYCODONE HCL 5 MG PO TABS
5.0000 mg | ORAL_TABLET | ORAL | Status: DC | PRN
  Administered 2012-10-15 (×2): 5 mg via ORAL
  Administered 2012-10-16 (×2): 10 mg via ORAL
  Filled 2012-10-15: qty 2
  Filled 2012-10-15: qty 1
  Filled 2012-10-15: qty 2
  Filled 2012-10-15: qty 1

## 2012-10-15 MED ORDER — SODIUM CHLORIDE 0.9 % IJ SOLN: 3.0000 mL | INTRAMUSCULAR | Status: DC | PRN

## 2012-10-15 MED ORDER — SODIUM CHLORIDE 0.9 % IJ SOLN
3.0000 mL | Freq: Two times a day (BID) | INTRAMUSCULAR | Status: DC
  Administered 2012-10-15: 3 mL via INTRAVENOUS

## 2012-10-15 MED ORDER — NAPROXEN 500 MG PO TABS
500.0000 mg | ORAL_TABLET | Freq: Two times a day (BID) | ORAL | Status: DC
  Filled 2012-10-15 (×3): qty 1

## 2012-10-15 MED ORDER — ACETAMINOPHEN 325 MG PO TABS: 325.0000 mg | ORAL_TABLET | Freq: Four times a day (QID) | ORAL | Status: DC | PRN

## 2012-10-15 NOTE — Progress Notes (Signed)
Shawn Mosley [id number] 13-Sep-1970   Subjective:  Feels better Mildly sore No N/V Tolerating fulls Had flatus  Objective:  Vital signs:  Filed Vitals:   10/14/12 1830 10/15/12 0631 10/15/12 0745 10/15/12 0822  BP: 113/74 119/77 133/77   Pulse: 103 95 102   Temp: 97.4 F (36.3 C) 98 F (36.7 C) 100 F (37.8 C)   TempSrc: Oral Oral Oral   Resp: 18 18 18    Height:      Weight:    217 lb 1.6 oz (98.476 kg)  SpO2: 95% 99% 94%     Last BM Date: 10/12/12  Intake/Output   Yesterday:  03/08 0701 - 03/09 0700 In: 1760 [P.O.:240; I.V.:1520] Out: 2150 [Urine:2150] This shift:     Bowel function:  Flatus: y  BM: small  Physical Exam:  General: Pt awake/alert/oriented x4 in no acute distress Eyes: PERRL, normal EOM.  Sclera clear.  No icterus Neuro: CN II-XII intact w/o focal sensory/motor deficits. Lymph: No head/neck/groin lymphadenopathy Psych:  No delerium/psychosis/paranoia HENT: Normocephalic, Mucus membranes moist.  No thrush Neck: Supple, No tracheal deviation Chest: No chest wall pain w good excursion CV:  Pulses intact.  Regular rhythm MS: Normal AROM mjr joints.  No obvious deformity Abdomen: Soft.  Obese.  Nondistended.  Mnimally tender at incisions only.  No incarcerated hernias.  OnQ in place - nearly collapsed.   Dressings dry Ext:  SCDs BLE.  No mjr edema.  No cyanosis Skin: No petechiae / purpurae  Problem List:  Principal Problem:   Diverticulitis of large intestine with perforation Active Problems:   Obesity (BMI 30-39.9)   Status post bladder repair   Assessment  Shawn Mosley  42 y.o. male  3 Days Post-Op  Procedure(s): PROCTOSCOPY LAPAROSCOPIC LOW ANTERIOR RESECTION  Recovering better  Plan:  -try solids PO -inc Cr improving - Follow off IVF.  Good UOP -f/u path -pain control -VTE prophylaxis- SCDs, heparin, etc -mobilize as tolerated to help recovery  D/C patient from hospital when patient meets criteria, probably  tomorrow:  Tolerating oral intake well Ambulating in walkways Adequate pain control without IV medications Urinating  Having flatus   Ardeth Sportsman, M.D., F.A.C.S. Gastrointestinal and Minimally Invasive Surgery [facility] [address] [place], [place] [address] [phone] Main / Paging [phone] Voice Mail   10/15/2012  CARE TEAM:  PCP: Default, Provider, MD  Outpatient Care Team: Patient Care Team: Provider Default, MD as PCP - General Rachael Fee, MD as Consulting Physician (Gastroenterology)  Inpatient Treatment Team: Treatment Team: Attending Provider: Ardeth Sportsman, MD; Registered Nurse: Horton Marshall; Registered Nurse: Leonie Douglas, RN; Registered Nurse: Adele Barthel, RN; Technician: Elicia Lamp, NT; Student Nurse: Lorrin Jackson Cross; Registered Nurse: Salomon Mast, RN   Results:   Labs: Results for orders placed during the hospital encounter of 10/12/12 (from the past 48 hour(s))  HEMOGLOBIN     Status: Abnormal   Collection Time    10/14/12  4:27 AM      Result Value Range   Hemoglobin 12.1 (*) 13.0 - 17.0 g/dL  POTASSIUM     Status: None   Collection Time    10/14/12  4:27 AM      Result Value Range   Potassium 4.5  3.5 - 5.1 mEq/L  CREATININE, SERUM     Status: Abnormal   Collection Time    10/14/12  4:27 AM      Result Value Range  Creatinine, Ser 1.59 (*) 0.50 - 1.35 mg/dL   GFR calc non Af Amer 51 (*) >90 mL/min   GFR calc Af Amer 59 (*) >90 mL/min   Comment:            The eGFR has been calculated     using the CKD EPI equation.     This calculation has not been     validated in all clinical     situations.     eGFR's persistently     <90 mL/min signify     possible Chronic Kidney Disease.  HEMOGLOBIN     Status: Abnormal   Collection Time    10/15/12  4:28 AM      Result Value Range   Hemoglobin 11.8 (*) 13.0 - 17.0 g/dL  POTASSIUM     Status: None   Collection Time     10/15/12  4:28 AM      Result Value Range   Potassium 4.2  3.5 - 5.1 mEq/L  CREATININE, SERUM     Status: Abnormal   Collection Time    10/15/12  4:28 AM      Result Value Range   Creatinine, Ser 1.39 (*) 0.50 - 1.35 mg/dL   GFR calc non Af Amer 60 (*) >90 mL/min   GFR calc Af Amer 70 (*) >90 mL/min   Comment:            The eGFR has been calculated     using the CKD EPI equation.     This calculation has not been     validated in all clinical     situations.     eGFR's persistently     <90 mL/min signify     possible Chronic Kidney Disease.    Imaging / Studies: No results found.  Medications / Allergies: per chart  Antibiotics: Anti-infectives   Start     Dose/Rate Route Frequency Ordered Stop   10/12/12 2200  cefoTEtan (CEFOTAN) 2 g in dextrose 5 % 50 mL IVPB     2 g 100 mL/hr over 30 Minutes Intravenous Every 12 hours 10/12/12 1645 10/12/12 2132   10/12/12 1215  clindamycin (CLEOCIN) 900 mg, gentamicin (GARAMYCIN) 240 mg in sodium chloride 0.9 % 1,000 mL for intraperitoneal lavage      Intraperitoneal To Surgery 10/12/12 1159 10/12/12 1351   10/12/12 0900  cefoTEtan (CEFOTAN) 2 g in dextrose 5 % 50 mL IVPB     2 g 100 mL/hr over 30 Minutes Intravenous To Surgery 10/12/12 0850 10/12/12 1107   10/12/12 0850  metroNIDAZOLE (FLAGYL) tablet 500 mg  Status:  Discontinued    Comments:  Take 2 pills (=1000mg ) by mouth at 1pm, 3pm, and 10pm the day before your colorectal operation   500 mg Oral As directed 10/12/12 0850 10/12/12 0858   10/12/12 0850  neomycin (MYCIFRADIN) tablet 500 mg  Status:  Discontinued     500 mg Oral As directed 10/12/12 0850 10/12/12 7829

## 2012-10-16 ENCOUNTER — Encounter (HOSPITAL_COMMUNITY): Payer: Self-pay | Admitting: Surgery

## 2012-10-16 LAB — CREATININE, SERUM
Creatinine, Ser: 1.44 mg/dL — ABNORMAL HIGH (ref 0.50–1.35)
GFR calc Af Amer: 67 mL/min — ABNORMAL LOW (ref >90)
GFR calc non Af Amer: 58 mL/min — ABNORMAL LOW (ref >90)

## 2012-10-16 LAB — HEMOGLOBIN: Hemoglobin: 11.4 g/dL — ABNORMAL LOW (ref 13.0–17.0)

## 2012-10-16 LAB — POTASSIUM: Potassium: 4.1 mEq/L (ref 3.5–5.1)

## 2012-10-16 MED ORDER — ACETAMINOPHEN 500 MG PO TABS: 1000.0000 mg | ORAL_TABLET | Freq: Three times a day (TID) | ORAL | Status: DC

## 2012-10-16 NOTE — Care Management Note (Signed)
    Page 1 of 1   10/16/2012     12:10:51 PM   CARE MANAGEMENT NOTE 10/16/2012  Patient:  Shawn Mosley, Shawn Mosley   Account Number:  [id number]  Date Initiated:  10/16/2012  Documentation initiated by:  Colleen Can  Subjective/Objective Assessment:   dx diverticulosis, diverticulitis with perforation; Low anterior resection 03/076/2014     Action/Plan:   Home upon discharge   Anticipated DC Date:  10/16/2012   Anticipated DC Plan:  HOME/SELF CARE      DC Planning Services  CM consult      Choice offered to / List presented to:             Status of service:  Completed, signed off Medicare Important Message given?   (If response is "NO", the following Medicare IM given date fields will be blank) Date Medicare IM given:   Date Additional Medicare IM given:    Discharge Disposition:  HOME/SELF CARE  Per UR Regulation:  Reviewed for med. necessity/level of care/duration of stay  If discussed at Long Length of Stay Meetings, dates discussed:    Comments:

## 2012-10-16 NOTE — Discharge Summary (Addendum)
Physician Discharge Summary  Patient ID: Shawn Mosley MRN: [id number] DOB/AGE: Dec 09, 1970 42 y.o.  Admit date: 10/12/2012 Discharge date: 10/16/2012  Admission Diagnoses:  Discharge Diagnoses:  Principal Problem:   Diverticulitis of large intestine with perforation Active Problems:   Obesity (BMI 30-39.9)   Status post bladder repair   Discharged Condition: good  Hospital Course:   Obese male with severe diverticulitis attack.  Underwent laparoscopically assisted resection.  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  Postoperatively, the patient was placed on an anti-ileus protocol.  The patient mobilized and advanced to a solid diet gradually.  Pain was well-controlled and transitioned off IV medications.    By the time of discharge, the patient was walking well the hallways, eating food well, having flatus.  Pain was-controlled on an oral regimen.  Had a mildly increased creatinine but urine output was good.  We held all nonsteroidals.   Based on meeting DC criteria and recovering well, I felt it was safe for the patient to be discharged home with close followup.  Instructions were discussed in detail with the patient and his nurse.  We will make arrangements for a voiding cystogram later in the week on postoperative day seven to make sure the bladder repair states intact.  Instructions are written as well.    Consults: None  Significant Diagnostic Studies:   Treatments: surgery:   PROCEDURE: Procedure(s):  PROCTOSCOPY  LAPAROSCOPIC LOW ANTERIOR RESECTION  Repair of dome of bladder      Discharge Exam: Blood pressure 128/73, pulse 90, temperature 98.5 F (36.9 C), temperature source Oral, resp. rate 18, height 5\' 9"  (1.753 m), weight 217 lb 1.6 oz (98.476 kg), SpO2 98.00%.  General: Pt awake/alert/oriented x4 in no major acute distress Eyes: PERRL, normal EOM. Sclera nonicteric Neuro: CN II-XII intact w/o focal  sensory/motor deficits. Lymph: No head/neck/groin lymphadenopathy Psych:  No delerium/psychosis/paranoia HENT: Normocephalic, Mucus membranes moist.  No thrush Neck: Supple, No tracheal deviation Chest: No pain.  Good respiratory excursion. CV:  Pulses intact.  Regular rhythm MS: Normal AROM mjr joints.  No obvious deformity Abdomen: Soft, Nondistended.  Nontender.  No incarcerated hernias.  OnQ & dressings removed - incisions clean.  Foley in place clear urine Ext:  SCDs BLE.  No significant edema.  No cyanosis Skin: No petechiae / purpurae   Disposition: 01-Home or Self Care  Discharge Orders   Future Orders Complete By Expires     DG Cystogram Voiding  10/19/2012 12/16/2013    Comments:      Patient with complex chronic diverticulitis.  Required colectomy with resection of dome of bladder with primary repair 10/12/2012.  Please evaluate.  If no evidence of leak, remove Foley catheter.    Questions:      Preferred imaging location?:  [facility]    Reason for exam:  s/p bladder repair.  r/o leak    Call MD for:  extreme fatigue  As directed     Call MD for:  hives  As directed     Call MD for:  persistant nausea and vomiting  As directed     Call MD for:  redness, tenderness, or signs of infection (pain, swelling, redness, odor or green/yellow discharge around incision site)  As directed     Call MD for:  severe uncontrolled pain  As directed     Call MD for:  As directed     Comments:  Temperature > 101.25F    Diet - low sodium heart healthy  As directed     Driving Restrictions  As directed     Comments:      No driving until off narcotics and can safely swerve away without pain during an emergency    Increase activity slowly  As directed     Comments:      Walk an hour a day.  Use 20-30 minute walks.  When you can walk 30 minutes without difficulty, increase to low impact/moderate activities such as biking, jogging, swimming, sexual activity..  Eventually can increase  to unrestricted activity when not feeling pain.  If you feel pain: STOP!Marland Kitchen   Let pain protect you from overdoing it.  Use ice/heat/over-the-counter pain medications to help minimize his soreness.  Use pain prescriptions as needed to remain active.  It is better to take extra pain medications and be more active than to stay bedridden to avoid all pain medications.    Lifting restrictions  As directed     Comments:      Avoid heavy lifting initially.  Do not push through pain.  You have no specific weight limit.  Coughing and sneezing or four more stressful to your incision than any lifting you will do. Pain will protect you from injury.  Therefore, avoid intense activity until off all narcotic pain medications.  Coughing and sneezing or four more stressful to your incision than any lifting he will do.    May shower / Bathe  As directed     May walk up steps  As directed     Sexual Activity Restrictions  As directed     Comments:      Sexual activity as tolerated.  Do not push through pain.  Pain will protect you from injury.    Walk with assistance  As directed     Comments:      Walk over an hour a day.  May use a walker/cane/companion to help with balance and stamina.        Medication List    TAKE these medications       acetaminophen 500 MG tablet  Commonly known as:  TYLENOL  Take 1,000 mg by mouth every 6 (six) hours as needed for pain.     aspirin EC 81 MG tablet  Take 81 mg by mouth daily.     oxyCODONE 5 MG immediate release tablet  Commonly known as:  Oxy IR/ROXICODONE  Take 1-2 tablets (5-10 mg total) by mouth every 4 (four) hours as needed for pain.     PROBIOTIC DAILY PO  Take 1 tablet by mouth daily.           Follow-up Information   Follow up with GROSS,STEVEN C., MD. Schedule an appointment as soon as possible for a visit in 2 weeks.   Contact information:   [address] [place] [address] [phone]       Signed: Ardeth Sportsman. 10/16/2012, 7:59 AM

## 2012-10-17 ENCOUNTER — Telehealth (INDEPENDENT_AMBULATORY_CARE_PROVIDER_SITE_OTHER): Payer: Self-pay

## 2012-10-17 NOTE — Telephone Encounter (Signed)
Called pt to notify him that the path report shows no cancer just diverticulitis per Dr Michaell Cowing. The pt understands.

## 2012-10-17 NOTE — Telephone Encounter (Signed)
LMOM for pt to call back so I can give him an appt for Thursday 10/19/12 at [facility] @10 :45/11:00 for the cystogram voiding. The pt will check in at [facility] 1st floor radiology with no prep and radiology made aware if the xray shows no leak they can pull the foley out per Dr Michaell Cowing. The pt has a f/u appt with Dr Michaell Cowing on 10/30/12 arrive at 11:30/11:45.

## 2012-10-17 NOTE — Telephone Encounter (Signed)
Pt returned call and was given appt dates and times.

## 2012-10-19 ENCOUNTER — Ambulatory Visit (HOSPITAL_COMMUNITY)
Admission: RE | Admit: 2012-10-19 | Discharge: 2012-10-19 | Disposition: A | Discharge status: Home / Self Care | Payer: BC Managed Care – PPO | Source: Ambulatory Visit | Attending: Surgery | Admitting: Surgery

## 2012-10-19 DIAGNOSIS — Z906 Acquired absence of other parts of urinary tract: Secondary | ICD-10-CM | POA: Insufficient documentation

## 2012-10-19 DIAGNOSIS — G4730 Sleep apnea, unspecified: Secondary | ICD-10-CM | POA: Insufficient documentation

## 2012-10-19 DIAGNOSIS — K5720 Diverticulitis of large intestine with perforation and abscess without bleeding: Secondary | ICD-10-CM

## 2012-10-19 DIAGNOSIS — Z86711 Personal history of pulmonary embolism: Secondary | ICD-10-CM | POA: Insufficient documentation

## 2012-10-19 DIAGNOSIS — K5732 Diverticulitis of large intestine without perforation or abscess without bleeding: Secondary | ICD-10-CM | POA: Insufficient documentation

## 2012-10-19 DIAGNOSIS — Z466 Encounter for fitting and adjustment of urinary device: Secondary | ICD-10-CM | POA: Insufficient documentation

## 2012-10-19 DIAGNOSIS — Z9889 Other specified postprocedural states: Secondary | ICD-10-CM | POA: Insufficient documentation

## 2012-10-19 MED ORDER — DIATRIZOATE MEGLUMINE 30 % UR SOLN
Freq: Once | URETHRAL | Status: AC | PRN
  Administered 2012-10-19: 250 mL

## 2012-10-24 ENCOUNTER — Telehealth (INDEPENDENT_AMBULATORY_CARE_PROVIDER_SITE_OTHER): Payer: Self-pay

## 2012-10-24 NOTE — Telephone Encounter (Signed)
[facility] notifying pt that Dr Michaell Cowing is bumping his office on 10/30/12 due to surgery. I r/s the appt to 10/26/12 arrive at 2:00 for 2:15.

## 2012-10-26 ENCOUNTER — Ambulatory Visit (INDEPENDENT_AMBULATORY_CARE_PROVIDER_SITE_OTHER): Payer: BC Managed Care – PPO | Admitting: Surgery

## 2012-10-26 ENCOUNTER — Encounter (INDEPENDENT_AMBULATORY_CARE_PROVIDER_SITE_OTHER): Payer: Self-pay | Admitting: Surgery

## 2012-10-26 VITALS — BP 128/70 | HR 104 | Temp 96.8°F | Resp 18 | Ht 69.0 in | Wt 202.2 lb

## 2012-10-26 DIAGNOSIS — Z9889 Other specified postprocedural states: Secondary | ICD-10-CM

## 2012-10-26 DIAGNOSIS — K5732 Diverticulitis of large intestine without perforation or abscess without bleeding: Secondary | ICD-10-CM

## 2012-10-26 DIAGNOSIS — E669 Obesity, unspecified: Secondary | ICD-10-CM

## 2012-10-26 DIAGNOSIS — K5720 Diverticulitis of large intestine with perforation and abscess without bleeding: Secondary | ICD-10-CM

## 2012-10-26 NOTE — Patient Instructions (Signed)
GETTING TO GOOD BOWEL HEALTH. Irregular bowel habits such as constipation and diarrhea can lead to many problems over time.  Having one soft bowel movement a day is the most important way to prevent further problems.  The anorectal canal is designed to handle stretching and feces to safely manage our ability to get rid of solid waste (feces, poop, stool) out of our body.  BUT, hard constipated stools can act like ripping concrete bricks and diarrhea can be a burning fire to this very sensitive area of our body, causing inflamed hemorrhoids, anal fissures, increasing risk is perirectal abscesses, abdominal pain/bloating, an making irritable bowel worse.     The goal: ONE SOFT BOWEL MOVEMENT A DAY!  To have soft, regular bowel movements:    Drink at least 8 tall glasses of water a day.     Take plenty of fiber.  Fiber is the undigested part of plant food that passes into the colon, acting s "natures broom" to encourage bowel motility and movement.  Fiber can absorb and hold large amounts of water. This results in a larger, bulkier stool, which is soft and easier to pass. Work gradually over several weeks up to 6 servings a day of fiber (25g a day even more if needed) in the form of: o Vegetables -- Root (potatoes, carrots, turnips), leafy green (lettuce, salad greens, celery, spinach), or cooked high residue (cabbage, broccoli, etc) o Fruit -- Fresh (unpeeled skin & pulp), Dried (prunes, apricots, cherries, etc ),  or stewed ( applesauce)  o Whole grain breads, pasta, etc (whole wheat)  o Bran cereals    Bulking Agents -- This type of water-retaining fiber generally is easily obtained each day by one of the following:  o Psyllium bran -- The psyllium plant is remarkable because its ground seeds can retain so much water. This product is available as Metamucil, Konsyl, Effersyllium, Per Diem Fiber, or the less expensive generic preparation in drug and health food stores. Although labeled a laxative, it really  is not a laxative.  o Methylcellulose -- This is another fiber derived from wood which also retains water. It is available as Citrucel. o Polyethylene Glycol - and "artificial" fiber commonly called Miralax or Glycolax.  It is helpful for people with gassy or bloated feelings with regular fiber o Flax Seed - a less gassy fiber than psyllium   No reading or other relaxing activity while on the toilet. If bowel movements take longer than 5 minutes, you are too constipated   AVOID CONSTIPATION.  High fiber and water intake usually takes care of this.  Sometimes a laxative is needed to stimulate more frequent bowel movements, but    Laxatives are not a good long-term solution as it can wear the colon out. o Osmotics (Milk of Magnesia, Fleets phosphosoda, Magnesium citrate, MiraLax, GoLytely) are safer than  o Stimulants (Senokot, Castor Oil, Dulcolax, Ex Lax)    o Do not take laxatives for more than 7days in a row.    IF SEVERELY CONSTIPATED, try a Bowel Retraining Program: o Do not use laxatives.  o Eat a diet high in roughage, such as bran cereals and leafy vegetables.  o Drink six (6) ounces of prune or apricot juice each morning.  o Eat two (2) large servings of stewed fruit each day.  o Take one (1) heaping tablespoon of a psyllium-based bulking agent twice a day. Use sugar-free sweetener when possible to avoid excessive calories.  o Eat a normal breakfast.  o   Set aside 15 minutes after breakfast to sit on the toilet, but do not strain to have a bowel movement.  o If you do not have a bowel movement by the third day, use an enema and repeat the above steps.    Controlling diarrhea o Switch to liquids and simpler foods for a few days to avoid stressing your intestines further. o Avoid dairy products (especially milk & ice cream) for a short time.  The intestines often can lose the ability to digest lactose when stressed. o Avoid foods that cause gassiness or bloating.  Typical foods include  beans and other legumes, cabbage, broccoli, and dairy foods.  Every person has some sensitivity to other foods, so listen to our body and avoid those foods that trigger problems for you. o Adding fiber (Citrucel, Metamucil, psyllium, Miralax) gradually can help thicken stools by absorbing excess fluid and retrain the intestines to act more normally.  Slowly increase the dose over a few weeks.  Too much fiber too soon can backfire and cause cramping & bloating. o Probiotics (such as active yogurt, Align, etc) may help repopulate the intestines and colon with normal bacteria and calm down a sensitive digestive tract.  Most studies show it to be of mild help, though, and such products can be costly. o Medicines:   Bismuth subsalicylate (ex. Kayopectate, Pepto Bismol) every 30 minutes for up to 6 doses can help control diarrhea.  Avoid if pregnant.   Loperamide (Immodium) can slow down diarrhea.  Start with two tablets (4mg total) first and then try one tablet every 6 hours.  Avoid if you are having fevers or severe pain.  If you are not better or start feeling worse, stop all medicines and call your doctor for advice o Call your doctor if you are getting worse or not better.  Sometimes further testing (cultures, endoscopy, X-ray studies, bloodwork, etc) may be needed to help diagnose and treat the cause of the diarrhea.  High-Fiber Diet Fiber is found in fruits, vegetables, and grains. A high-fiber diet encourages the addition of more whole grains, legumes, fruits, and vegetables in your diet. The recommended amount of fiber for adult males is 38 g per day. For adult females, it is 25 g per day. Pregnant and lactating women should get 28 g of fiber per day. If you have a digestive or bowel problem, ask your caregiver for advice before adding high-fiber foods to your diet. Eat a variety of high-fiber foods instead of only a select few type of foods.  PURPOSE  To increase stool bulk.  To make bowel  movements more regular to prevent constipation.  To lower cholesterol.  To prevent overeating. WHEN IS THIS DIET USED?  It may be used if you have constipation and hemorrhoids.  It may be used if you have uncomplicated diverticulosis (intestine condition) and irritable bowel syndrome.  It may be used if you need help with weight management.  It may be used if you want to add it to your diet as a protective measure against atherosclerosis, diabetes, and cancer. SOURCES OF FIBER  Whole-grain breads and cereals.  Fruits, such as apples, oranges, bananas, berries, prunes, and pears.  Vegetables, such as green peas, carrots, sweet potatoes, beets, broccoli, cabbage, spinach, and artichokes.  Legumes, such split peas, soy, lentils.  Almonds. FIBER CONTENT IN FOODS Starches and Grains / Dietary Fiber (g)  Cheerios, 1 cup / 3 g  Corn Flakes cereal, 1 cup / 0.7 g  Rice crispy   treat cereal, 1 cup / 0.3 g  Instant oatmeal (cooked),  cup / 2 g  Frosted wheat cereal, 1 cup / 5.1 g  Brown, long-grain rice (cooked), 1 cup / 3.5 g  White, long-grain rice (cooked), 1 cup / 0.6 g  Enriched macaroni (cooked), 1 cup / 2.5 g Legumes / Dietary Fiber (g)  Baked beans (canned, plain, or vegetarian),  cup / 5.2 g  Kidney beans (canned),  cup / 6.8 g  Pinto beans (cooked),  cup / 5.5 g Breads and Crackers / Dietary Fiber (g)  Plain or honey graham crackers, 2 squares / 0.7 g  Saltine crackers, 3 squares / 0.3 g  Plain, salted pretzels, 10 pieces / 1.8 g  Whole-wheat bread, 1 slice / 1.9 g  White bread, 1 slice / 0.7 g  Raisin bread, 1 slice / 1.2 g  Plain bagel, 3 oz / 2 g  Flour tortilla, 1 oz / 0.9 g  Corn tortilla, 1 small / 1.5 g  Hamburger or hotdog bun, 1 small / 0.9 g Fruits / Dietary Fiber (g)  Apple with skin, 1 medium / 4.4 g  Sweetened applesauce,  cup / 1.5 g  Banana,  medium / 1.5 g  Grapes, 10 grapes / 0.4 g  Orange, 1 small / 2.3  g  Raisin, 1.5 oz / 1.6 g  Melon, 1 cup / 1.4 g Vegetables / Dietary Fiber (g)  Green beans (canned),  cup / 1.3 g  Carrots (cooked),  cup / 2.3 g  Broccoli (cooked),  cup / 2.8 g  Peas (cooked),  cup / 4.4 g  Mashed potatoes,  cup / 1.6 g  Lettuce, 1 cup / 0.5 g  Corn (canned),  cup / 1.6 g  Tomato,  cup / 1.1 g Document Released: 07/26/2005 Document Revised: 01/25/2012 Document Reviewed: 10/28/2011 [employer] Patient Information 2013 [employer].  

## 2012-10-26 NOTE — Progress Notes (Signed)
Subjective:     Patient ID: Shawn Mosley, male   DOB: 04/05/1971, 42 y.o.   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  Soreness down.  Just using occasional Tylenol.  Appetite not back to normal yet.  He wonders if he should take an oral supplement such as boost.  Having daily bowel movements.  Wants to try eating nuts and seeds.  Overall in good spirits.   Patient Active Problem List  Diagnosis  . Diverticulitis of large intestine with perforation  . Obesity (BMI 30-39.9)  . Status post bladder repair    Past Medical History  Diagnosis Date  . Diverticulitis 2012  . Fistula     Diverticular abscess with fistula.  Resolved August 2012  . Obesity (BMI 30-39.9)   . PE (pulmonary thromboembolism) 2012-2013    Resolved with warfarin x1 year  . Sleep apnea     Cpap    Past Surgical History  Procedure Laterality Date  . Colonoscopy  [id number]  . Laparoscopic low anterior resection  10/12/2012  . Bladder repair  10/12/2012  . Laparoscopy abdomen diagnostic  12/10/2010    Washout of diverticular abscess  . Proctoscopy N/A 10/12/2012    Procedure: PROCTOSCOPY;  Surgeon: Ardeth Sportsman, MD;  Location: [facility];  Service: General;  Laterality: N/A;  . Laparoscopic low anterior resection N/A 10/12/2012    Procedure: LAPAROSCOPIC LOW ANTERIOR RESECTION;  Surgeon: Ardeth Sportsman, MD;  Location: [facility];  Service: General;  Laterality: N/A;  bladder repair    History   Social History  . Marital Status: Divorced    Spouse Name: N/A    Number of  Children: 3  . Years of Education: N/A   Occupational History  . Mang[employer]     Social History Main Topics  . Smoking status: Never Smoker   . Smokeless tobacco: Never Used  . Alcohol Use: 0.0 oz/week     Comment: occasional  . Drug Use: No  . Sexually Active: Not on file   Other Topics Concern  . Not on file   Social History Narrative   2 caffeine drinks daily     Family History  Problem Relation Age of Onset  . Liver cancer Father   . Colon cancer Neg Hx     Current Outpatient Prescriptions  Medication Sig Dispense Refill  . acetaminophen (TYLENOL) 500 MG tablet Take 1,000 mg by mouth every 6 (six) hours as needed for pain.      Marland Kitchen aspirin EC 81 MG tablet Take 81 mg by mouth daily.      Marland Kitchen oxyCODONE (OXY IR/ROXICODONE) 5 MG immediate release tablet Take 1-2 tablets (5-10 mg total) by mouth every 4 (four) hours as needed for pain.  50 tablet  0  . Probiotic Product (PROBIOTIC DAILY PO) Take 1 tablet by mouth daily.        No current facility-administered medications for this visit.     No Known Allergies  BP 128/70  Pulse 104  Temp(Src) 96.8 F (36 C) (Temporal)  Resp 18  Ht  5\' 9"  (1.753 m)  Wt 202 lb 3.2 oz (91.717 kg)  BMI 29.85 kg/m2  Dg Cystogram Voiding  10/19/2012  *RADIOLOGY REPORT*  Clinical Data:  Prior history of ruptured diverticulitis, with involvement of the urinary bladder by the inflammatory process at the time of surgery.  Indwelling Foley catheter over the past week after recent surgery.  We have been asked to evaluate for leak prior to Foley catheter removal.  VOIDING CYSTOURETHROGRAM  Technique:  After catheterization of the urinary bladder by the radiology nurse using sterile technique, the bladder was filled with approximately 250 cc cystogram than 30% by drip infusion. Serial spot images were obtained during bladder filling and voiding. Images were obtained using last image hold in order to minimize radiation dose. Pulsed fluoroscopy was utilized  as well to minimize radiation dose.  Comparison: No prior cystogram.  Findings: There is no evidence of bladder leak during bladder filling.  The patient described bladder fullness after approximately 200 ml.  The patient attempted to void around the indwelling catheter, and was able to void with intermittent success.  He was unable to void around the catheter while standing. There is no evidence of bladder leak during Valsalva.  Ultimately, the bladder was drained via gravity, and again, there is no evidence of leak.  At the request of Dr. Michaell Cowing, the Foley catheter was removed by the radiology nurse without difficulty.  Fluoroscopy Time: 1 minute 13 seconds, pulsed fluoroscopy  IMPRESSION: No evidence of bladder leak.  The Foley catheter was removed at the request of Dr. Michaell Cowing.  Results were discussed with the patient at the time of the examination.  The patient was counseled to contact Dr. Michaell Cowing should if he is unable to void at home.   Original Report Authenticated By: Hulan Saas, M.D.      Review of Systems  Constitutional: Negative for fever, chills and diaphoresis.  HENT: Negative for sore throat, trouble swallowing and neck pain.   Eyes: Negative for photophobia and visual disturbance.  Respiratory: Negative for choking and shortness of breath.   Cardiovascular: Negative for chest pain and palpitations.  Gastrointestinal: Negative for nausea, vomiting, abdominal distention, anal bleeding and rectal pain.  Genitourinary: Negative for dysuria, urgency, difficulty urinating and testicular pain.  Musculoskeletal: Negative for myalgias, arthralgias and gait problem.  Skin: Negative for color change and rash.  Neurological: Negative for dizziness, speech difficulty, weakness and numbness.  Hematological: Negative for adenopathy.  Psychiatric/Behavioral: Negative for hallucinations, confusion and agitation.       Objective:   Physical Exam  Constitutional: He is oriented to person, place,  and time. He appears well-developed and well-nourished. No distress.  HENT:  Head: Normocephalic.  Mouth/Throat: Oropharynx is clear and moist. No oropharyngeal exudate.  Eyes: Conjunctivae and EOM are normal. Pupils are equal, round, and reactive to light. No scleral icterus.  Neck: Normal range of motion. No tracheal deviation present.  Cardiovascular: Normal rate, normal heart sounds and intact distal pulses.   Pulmonary/Chest: Effort normal. No respiratory distress.  Abdominal: Soft. He exhibits no distension. There is no tenderness. Hernia confirmed negative in the right inguinal area and confirmed negative in the left inguinal area.  Incisions clean with normal healing ridges.  No hernias  Musculoskeletal: Normal range of motion. He exhibits no tenderness.  Neurological: He is alert and oriented to person, place, and time. No cranial nerve deficit. He exhibits normal muscle tone. Coordination normal.  Skin: Skin is warm and dry. No rash noted. He is not  diaphoretic.  Psychiatric: He has a normal mood and affect. His behavior is normal.       Assessment:     Two weeks status post laparoscopically assisted rectosigmoid colectomy and bladder repair, recovering remarkably well so far.     Plan:     Increase activity as tolerated to regular activity.  Do not push through pain.  Diet as tolerated. Bowel regimen to avoid problems.  High fiber diet the most protective option.  Okay deep nuts and seeds he chews his food real well.  If he is eating at least three meals, he is probably getting sufficient protein & nutrition.  Okay to try some boost if he wants.  Return to clinic 3weeks.  I ffeeling great, can make it p.r.n.   Instructions discussed.  Followup with primary care physician for other health issues as would normally be done.  Questions answered.  The patient expressed understanding and appreciation

## 2012-10-30 ENCOUNTER — Encounter (INDEPENDENT_AMBULATORY_CARE_PROVIDER_SITE_OTHER): Payer: BC Managed Care – PPO | Admitting: Surgery

## 2012-11-15 ENCOUNTER — Encounter (INDEPENDENT_AMBULATORY_CARE_PROVIDER_SITE_OTHER): Payer: BC Managed Care – PPO | Admitting: Surgery

## 2016-04-15 ENCOUNTER — Ambulatory Visit (INDEPENDENT_AMBULATORY_CARE_PROVIDER_SITE_OTHER): Payer: BLUE CROSS/BLUE SHIELD | Admitting: Physician Assistant

## 2016-04-15 ENCOUNTER — Encounter: Payer: Self-pay | Admitting: Physician Assistant

## 2016-04-15 VITALS — BP 137/83 | HR 73 | Temp 97.7°F | Ht 69.0 in | Wt 257.4 lb

## 2016-04-15 DIAGNOSIS — G4700 Insomnia, unspecified: Secondary | ICD-10-CM | POA: Diagnosis not present

## 2016-04-15 DIAGNOSIS — E669 Obesity, unspecified: Secondary | ICD-10-CM

## 2016-04-15 DIAGNOSIS — E291 Testicular hypofunction: Secondary | ICD-10-CM

## 2016-04-15 DIAGNOSIS — G4733 Obstructive sleep apnea (adult) (pediatric): Secondary | ICD-10-CM

## 2016-04-15 DIAGNOSIS — E349 Endocrine disorder, unspecified: Secondary | ICD-10-CM

## 2016-04-15 DIAGNOSIS — K5720 Diverticulitis of large intestine with perforation and abscess without bleeding: Secondary | ICD-10-CM | POA: Diagnosis not present

## 2016-04-15 DIAGNOSIS — IMO0001 Reserved for inherently not codable concepts without codable children: Secondary | ICD-10-CM

## 2016-04-15 MED ORDER — ALPRAZOLAM 0.5 MG PO TABS: 0.5000 mg | ORAL_TABLET | Freq: Every day | ORAL | 1 refills | Status: DC

## 2016-04-15 NOTE — Progress Notes (Signed)
BP 137/83 (BP Location: Left Arm, Patient Position: Sitting, Cuff Size: Large)   Pulse 73   Temp 97.7 F (36.5 C) (Oral)   Ht _0  (1.753 m)   Wt 257 lb 6.4 oz (116.8 kg)   BMI 38.01 kg/m    Subjective:    Patient ID: Shawn Mosley, male    DOB: 1971-05-26, 45 y.o.   MRN: [id number]  Shawn Mosley is a 45 y.o. male presenting on 04/15/2016 for CPAP (Needs supplies for cpap machine )   HPI Patient here to be established as new patient at [place].  This patient is known to me from [facility]. The patient's medical history is positive for obstructive sleep apnea that was severe at his 2009 testing. We do have a current sleep study performed through [facility] sleep lab in [place]. At that time he was placed on a CPAP with 11 cm pressure. He states he has felt much better since using the CPAP. He knows the machine is several years old. It has not been surrounding quiet as it was in the past. He feels that he probably needs new machine and supplies. He has used [facility] in the past and we'll plan to send order to them.  Patient's other chronic medical conditions include low testosterone, history of diverticulitis with a rupture. After he had the diverticulitis he did develop a DVT and had to take anticoagulation therapy for several months before he can go back and have his repair surgery. He also still suffers somewhat with some insomnia. He has a high stress job. He has some difficult relationships in his life that sometimes have him not sleeping well. He rarely uses Xanax at bedtime but would like to have some on hand for this. He would also like to have his testosterone rechecked. He has not had medication for this in several years.   He had a lipid panel performed through his work screening that was normal and a glucose that was normal. He did not have a complete comprehensive metabolic panel, TSH, or CBC performed. We will have  these labs drawn today.   Relevant past medical, surgical, family and social history reviewed and updated as indicated. Interim medical history since our last visit reviewed. Allergies and medications reviewed and updated.   Data reviewed from any sources in EPIC.  Review of Systems  Constitutional: Negative.  Negative for appetite change and fatigue.  HENT: Negative.   Eyes: Negative.  Negative for pain and visual disturbance.  Respiratory: Negative.  Negative for cough, chest tightness, shortness of breath and wheezing.   Cardiovascular: Negative.  Negative for chest pain, palpitations and leg swelling.  Gastrointestinal: Negative.  Negative for abdominal pain, diarrhea, nausea and vomiting.  Endocrine: Negative.   Genitourinary: Negative.   Musculoskeletal: Negative.   Skin: Negative.  Negative for color change and rash.  Neurological: Negative.  Negative for weakness, numbness and headaches.  Psychiatric/Behavioral: Negative.     Per HPI unless specifically indicated above  Social History   Social History  . Marital status: Divorced    Spouse name: N/A  . Number of children: 3  . Years of education: N/A   Occupational History  . [employer]     Social History Main Topics  . Smoking status: Never Smoker  . Smokeless tobacco: Never Used  . Alcohol use 0.0 oz/week     Comment: occasional  . Drug use: No  . Sexual activity: Not on  file   Other Topics Concern  . Not on file   Social History Narrative   2 caffeine drinks daily     Past Surgical History:  Procedure Laterality Date  . BLADDER REPAIR  10/12/2012  . COLONOSCOPY  [id number]  . LAPAROSCOPIC LOW ANTERIOR RESECTION  10/12/2012  . LAPAROSCOPIC LOW ANTERIOR RESECTION N/A 10/12/2012   Procedure: LAPAROSCOPIC LOW ANTERIOR RESECTION;  Surgeon: Adin Hector, MD;  Location: [facility];  Service: General;  Laterality: N/A;  bladder repair  . LAPAROSCOPY ABDOMEN DIAGNOSTIC  12/10/2010   Washout of diverticular abscess  .  PROCTOSCOPY N/A 10/12/2012   Procedure: PROCTOSCOPY;  Surgeon: Adin Hector, MD;  Location: [facility];  Service: General;  Laterality: N/A;    Family History  Problem Relation Age of Onset  . Liver cancer Father   . Colon cancer Neg Hx       Medication List       Accurate as of 04/15/16  2:06 PM. Always use your most recent med list.          ALPRAZolam 0.5 MG tablet Commonly known as:  XANAX Take 1 tablet (0.5 mg total) by mouth at bedtime.   aspirin EC 81 MG tablet Take 81 mg by mouth daily.          Objective:    BP 137/83 (BP Location: Left Arm, Patient Position: Sitting, Cuff Size: Large)   Pulse 73   Temp 97.7 F (36.5 C) (Oral)   Ht _0  (1.753 m)   Wt 257 lb 6.4 oz (116.8 kg)   BMI 38.01 kg/m   No Known Allergies Wt Readings from Last 3 Encounters:  04/15/16 257 lb 6.4 oz (116.8 kg)  10/26/12 202 lb 3.2 oz (91.7 kg)  10/15/12 217 lb 1.6 oz (98.5 kg)    Physical Exam  Constitutional: He appears well-developed and well-nourished.  HENT:  Head: Normocephalic and atraumatic.  Eyes: Conjunctivae and EOM are normal. Pupils are equal, round, and reactive to light.  Neck: Normal range of motion. Neck supple.  Cardiovascular: Normal rate, regular rhythm and normal heart sounds.   Pulmonary/Chest: Effort normal and breath sounds normal.  Abdominal: Soft. Bowel sounds are normal.  Musculoskeletal: Normal range of motion.  Skin: Skin is warm and dry.  Nursing note and vitals reviewed.       Assessment & Plan:   1. Sleep apnea, obstructive - For home use only DME continuous positive airway pressure (CPAP)  2. Hypotestosteronemia Lab study today - Testosterone  3. Diverticulitis of large intestine with perforation History, no other complicatons, normal colonoscopy after surgery  4. Obesity (BMI 30-39.9) Diet and exercise counseling DASH diet information given. May consider formal appointments with pharmacy for options with help  5. Insomnia -  ALPRAZolam (XANAX) 0.5 MG tablet; Take 1 tablet (0.5 mg total) by mouth at bedtime.  Dispense: 60 tablet; Refill: 1  6. Well adult Not performed today, in chart for his annual well labs - CBC with Differential/Platelet - CMP14+EGFR - TSH   Continue all other maintenance medications as listed above.  Follow up plan: Return in about 6 months (around 10/13/2016).  Terald Sleeper PA-C [facility] [address]  [place], [place] [address] [phone]   04/15/2016, 2:06 PM

## 2016-04-15 NOTE — Patient Instructions (Signed)
DASH Eating Plan  DASH stands for "Dietary Approaches to Stop Hypertension." The DASH eating plan is a healthy eating plan that has been shown to reduce high blood pressure (hypertension). Additional health benefits may include reducing the risk of type 2 diabetes mellitus, heart disease, and stroke. The DASH eating plan may also help with weight loss.  WHAT DO I NEED TO KNOW ABOUT THE DASH EATING PLAN?  For the DASH eating plan, you will follow these general guidelines:  · Choose foods with a percent daily value for sodium of less than 5% (as listed on the food label).  · Use salt-free seasonings or herbs instead of table salt or sea salt.  · Check with your health care provider or pharmacist before using salt substitutes.  · Eat lower-sodium products, often labeled as "lower sodium" or "no salt added."  · Eat fresh foods.  · Eat more vegetables, fruits, and low-fat dairy products.  · Choose whole grains. Look for the word "whole" as the first word in the ingredient list.  · Choose fish and skinless chicken or turkey more often than red meat. Limit fish, poultry, and meat to 6 oz (170 g) each day.  · Limit sweets, desserts, sugars, and sugary drinks.  · Choose heart-healthy fats.  · Limit cheese to 1 oz (28 g) per day.  · Eat more home-cooked food and less restaurant, buffet, and fast food.  · Limit fried foods.  · Cook foods using methods other than frying.  · Limit canned vegetables. If you do use them, rinse them well to decrease the sodium.  · When eating at a restaurant, ask that your food be prepared with less salt, or no salt if possible.  WHAT FOODS CAN I EAT?  Seek help from a dietitian for individual calorie needs.  Grains  Whole grain or whole wheat bread. Brown rice. Whole grain or whole wheat pasta. Quinoa, bulgur, and whole grain cereals. Low-sodium cereals. Corn or whole wheat flour tortillas. Whole grain cornbread. Whole grain crackers. Low-sodium crackers.  Vegetables  Fresh or frozen vegetables  (raw, steamed, roasted, or grilled). Low-sodium or reduced-sodium tomato and vegetable juices. Low-sodium or reduced-sodium tomato sauce and paste. Low-sodium or reduced-sodium canned vegetables.   Fruits  All fresh, canned (in natural juice), or frozen fruits.  Meat and Other Protein Products  Ground beef (85% or leaner), grass-fed beef, or beef trimmed of fat. Skinless chicken or turkey. Ground chicken or turkey. Pork trimmed of fat. All fish and seafood. Eggs. Dried beans, peas, or lentils. Unsalted nuts and seeds. Unsalted canned beans.  Dairy  Low-fat dairy products, such as skim or 1% milk, 2% or reduced-fat cheeses, low-fat ricotta or cottage cheese, or plain low-fat yogurt. Low-sodium or reduced-sodium cheeses.  Fats and Oils  Tub margarines without trans fats. Light or reduced-fat mayonnaise and salad dressings (reduced sodium). Avocado. Safflower, olive, or canola oils. Natural peanut or almond butter.  Other  Unsalted popcorn and pretzels.  The items listed above may not be a complete list of recommended foods or beverages. Contact your dietitian for more options.  WHAT FOODS ARE NOT RECOMMENDED?  Grains  White bread. White pasta. White rice. Refined cornbread. Bagels and croissants. Crackers that contain trans fat.  Vegetables  Creamed or fried vegetables. Vegetables in a cheese sauce. Regular canned vegetables. Regular canned tomato sauce and paste. Regular tomato and vegetable juices.  Fruits  Dried fruits. Canned fruit in light or heavy syrup. Fruit juice.  Meat and Other Protein   Products  Fatty cuts of meat. Ribs, chicken wings, bacon, sausage, bologna, salami, chitterlings, fatback, hot dogs, bratwurst, and packaged luncheon meats. Salted nuts and seeds. Canned beans with salt.  Dairy  Whole or 2% milk, cream, half-and-half, and cream cheese. Whole-fat or sweetened yogurt. Full-fat cheeses or blue cheese. Nondairy creamers and whipped toppings. Processed cheese, cheese spreads, or cheese  curds.  Condiments  Onion and garlic salt, seasoned salt, table salt, and sea salt. Canned and packaged gravies. Worcestershire sauce. Tartar sauce. Barbecue sauce. Teriyaki sauce. Soy sauce, including reduced sodium. Steak sauce. Fish sauce. Oyster sauce. Cocktail sauce. Horseradish. Ketchup and mustard. Meat flavorings and tenderizers. Bouillon cubes. Hot sauce. Tabasco sauce. Marinades. Taco seasonings. Relishes.  Fats and Oils  Butter, stick margarine, lard, shortening, ghee, and bacon fat. Coconut, palm kernel, or palm oils. Regular salad dressings.  Other  Pickles and olives. Salted popcorn and pretzels.  The items listed above may not be a complete list of foods and beverages to avoid. Contact your dietitian for more information.  WHERE CAN I FIND MORE INFORMATION?  National Heart, Lung, and Blood Institute: www.nhlbi.nih.gov/health/health-topics/topics/dash/     This information is not intended to replace advice given to you by your health care provider. Make sure you discuss any questions you have with your health care provider.     Document Released: 07/15/2011 Document Revised: 08/16/2014 Document Reviewed: 05/30/2013  Elsevier Interactive Patient Education ©2016 Elsevier Inc.

## 2016-04-16 LAB — CBC WITH DIFFERENTIAL/PLATELET
Basophils Absolute: 0 10*3/uL (ref 0.0–0.2)
Basos: 0 %
EOS (ABSOLUTE): 0.2 10*3/uL (ref 0.0–0.4)
Eos: 2 %
Hematocrit: 43 % (ref 37.5–51.0)
Hemoglobin: 14.5 g/dL (ref 12.6–17.7)
Immature Grans (Abs): 0 10*3/uL (ref 0.0–0.1)
Immature Granulocytes: 0 %
Lymphocytes Absolute: 2.6 10*3/uL (ref 0.7–3.1)
Lymphs: 31 %
MCH: 28.9 pg (ref 26.6–33.0)
MCHC: 33.7 g/dL (ref 31.5–35.7)
MCV: 86 fL (ref 79–97)
Monocytes Absolute: 0.8 10*3/uL (ref 0.1–0.9)
Monocytes: 10 %
Neutrophils Absolute: 4.6 10*3/uL (ref 1.4–7.0)
Neutrophils: 57 %
Platelets: 432 10*3/uL — ABNORMAL HIGH (ref 150–379)
RBC: 5.01 x10E6/uL (ref 4.14–5.80)
RDW: 15.3 % (ref 12.3–15.4)
WBC: 8.1 10*3/uL (ref 3.4–10.8)

## 2016-04-16 LAB — CMP14+EGFR
ALT: 35 IU/L (ref 0–44)
AST: 27 IU/L (ref 0–40)
Albumin/Globulin Ratio: 1.6 (ref 1.2–2.2)
Albumin: 4.7 g/dL (ref 3.5–5.5)
Alkaline Phosphatase: 62 IU/L (ref 39–117)
BUN/Creatinine Ratio: 11 (ref 9–20)
BUN: 16 mg/dL (ref 6–24)
Bilirubin Total: 0.4 mg/dL (ref 0.0–1.2)
CO2: 25 mmol/L (ref 18–29)
Calcium: 10.1 mg/dL (ref 8.7–10.2)
Chloride: 98 mmol/L (ref 96–106)
Creatinine, Ser: 1.41 mg/dL — ABNORMAL HIGH (ref 0.76–1.27)
GFR calc Af Amer: 68 mL/min/{1.73_m2} (ref >59)
GFR calc non Af Amer: 59 mL/min/{1.73_m2} — ABNORMAL LOW (ref >59)
Globulin, Total: 3 g/dL (ref 1.5–4.5)
Glucose: 96 mg/dL (ref 65–99)
Potassium: 4.8 mmol/L (ref 3.5–5.2)
Sodium: 140 mmol/L (ref 134–144)
Total Protein: 7.7 g/dL (ref 6.0–8.5)

## 2016-04-16 LAB — TSH: TSH: 1.25 u[IU]/mL (ref 0.450–4.500)

## 2016-04-16 LAB — TESTOSTERONE: Testosterone: 222 ng/dL — ABNORMAL LOW (ref 264–916)

## 2016-04-19 ENCOUNTER — Telehealth: Payer: Self-pay | Admitting: Physician Assistant

## 2016-04-19 ENCOUNTER — Telehealth: Payer: Self-pay | Admitting: *Deleted

## 2016-04-19 DIAGNOSIS — E349 Endocrine disorder, unspecified: Secondary | ICD-10-CM

## 2016-04-19 DIAGNOSIS — G4733 Obstructive sleep apnea (adult) (pediatric): Secondary | ICD-10-CM

## 2016-04-19 MED ORDER — TESTOSTERONE CYPIONATE 200 MG/ML IM SOLN: 200.0000 mg | INTRAMUSCULAR | 5 refills | Status: DC

## 2016-04-19 MED ORDER — TESTOSTERONE 30 MG/ACT TD SOLN: 60.0000 mg | Freq: Every day | TRANSDERMAL | 5 refills | Status: DC

## 2016-04-19 NOTE — Addendum Note (Signed)
Addended by: Remus LofflerJONES, Dyrell Tuccillo S on: 04/19/2016 03:31 PM   Modules accepted: Orders

## 2016-04-19 NOTE — Telephone Encounter (Signed)
Patient would like to get a prescription for a new sleep apnea machine along with the supplies.

## 2016-04-19 NOTE — Telephone Encounter (Signed)
Prepared scripts. Axiron can be called in. CPAP order created, may need study attached.

## 2016-04-19 NOTE — Telephone Encounter (Signed)
rx called into pharmacy

## 2016-04-19 NOTE — Telephone Encounter (Signed)
Script prepared, can call into the pharmacy

## 2016-04-19 NOTE — Telephone Encounter (Signed)
Script for Axiron prepared. Script for CPAP and supplies.

## 2016-04-19 NOTE — Telephone Encounter (Signed)
Patient states that he would like to try axiron instead of the injection.

## 2016-04-22 ENCOUNTER — Telehealth: Payer: Self-pay | Admitting: Physician Assistant

## 2016-04-22 DIAGNOSIS — E291 Testicular hypofunction: Secondary | ICD-10-CM

## 2016-04-22 DIAGNOSIS — E349 Endocrine disorder, unspecified: Secondary | ICD-10-CM

## 2016-04-22 MED ORDER — TESTOSTERONE 40.5 MG/2.5GM (1.62%) TD GEL: 1.0000 | Freq: Every day | TRANSDERMAL | 5 refills | Status: DC

## 2016-04-22 NOTE — Telephone Encounter (Signed)
Rx called into pharmacy and patient aware 

## 2016-04-22 NOTE — Telephone Encounter (Signed)
New testosterone order

## 2016-04-30 ENCOUNTER — Telehealth: Payer: Self-pay | Admitting: Physician Assistant

## 2016-04-30 MED ORDER — TESTOSTERONE CYPIONATE 200 MG/ML IM SOLN: 200.0000 mg | INTRAMUSCULAR | 5 refills | Status: DC

## 2016-04-30 NOTE — Telephone Encounter (Signed)
Prepared script

## 2016-06-29 ENCOUNTER — Ambulatory Visit (INDEPENDENT_AMBULATORY_CARE_PROVIDER_SITE_OTHER): Payer: BLUE CROSS/BLUE SHIELD | Admitting: Physician Assistant

## 2016-06-29 ENCOUNTER — Encounter: Payer: Self-pay | Admitting: Physician Assistant

## 2016-06-29 VITALS — BP 155/86 | HR 98 | Temp 97.4°F | Ht 69.0 in | Wt 264.2 lb

## 2016-06-29 DIAGNOSIS — R319 Hematuria, unspecified: Secondary | ICD-10-CM | POA: Diagnosis not present

## 2016-06-29 DIAGNOSIS — R10A0 Flank pain, unspecified side: Secondary | ICD-10-CM

## 2016-06-29 DIAGNOSIS — R109 Unspecified abdominal pain: Secondary | ICD-10-CM | POA: Diagnosis not present

## 2016-06-29 LAB — URINALYSIS, COMPLETE
Bilirubin, UA: NEGATIVE
Glucose, UA: NEGATIVE
Ketones, UA: NEGATIVE
Leukocytes, UA: NEGATIVE
Nitrite, UA: NEGATIVE
Protein, UA: NEGATIVE
Specific Gravity, UA: 1.005 (ref 1.005–1.030)
Urobilinogen, Ur: 0.2 mg/dL (ref 0.2–1.0)
pH, UA: 7 (ref 5.0–7.5)

## 2016-06-29 LAB — MICROSCOPIC EXAMINATION
Bacteria, UA: NONE SEEN
Renal Epithel, UA: NONE SEEN /hpf
WBC, UA: NONE SEEN /hpf (ref 0–?)

## 2016-06-29 NOTE — Progress Notes (Signed)
BP (!) 155/86   Pulse 98   Temp 97.4 F (36.3 C) (Oral)   Ht _0  (1.753 m)   Wt 264 lb 3.2 oz (119.8 kg)   BMI 39.02 kg/m    Subjective:    Patient ID: Shawn Mosley, male    DOB: 1971/04/24, 45 y.o.   MRN: [id number]  HPI: Shawn Mosley is a 45 y.o. male presenting on 06/29/2016 for Hematuria  Recently has PE for work and hematuria see, had distant episode of this. Old history of kidney stones. Has noted occasional left flack pain but no frank blood is seen.  Tried to get water in more now.  Needs rechecking on this and will get old records of IVP evaluation.  Relevant past medical, surgical, family and social history reviewed and updated as indicated. Allergies and medications reviewed and updated.  Past Medical History:  Diagnosis Date  . Diverticulitis 2012  . 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 Obesity (BMI 30-39.9)   . PE (pulmonary thromboembolism) ([place]) 2012-2013   Resolved with warfarin x1 year  . Sleep apnea    Cpap    Past Surgical History:  Procedure Laterality Date  . BLADDER REPAIR  10/12/2012  . COLONOSCOPY  [id number]  . LAPAROSCOPIC LOW ANTERIOR RESECTION  10/12/2012  . LAPAROSCOPIC LOW ANTERIOR RESECTION N/A 10/12/2012   Procedure: LAPAROSCOPIC LOW ANTERIOR RESECTION;  Surgeon: Adin Hector, MD;  Location: [facility];  Service: General;  Laterality: N/A;  bladder repair  . LAPAROSCOPY ABDOMEN DIAGNOSTIC  12/10/2010   Washout of diverticular abscess  . PROCTOSCOPY N/A 10/12/2012   Procedure: PROCTOSCOPY;  Surgeon: Adin Hector, MD;  Location: [facility];  Service: General;  Laterality: N/A;    Review of Systems  Constitutional: Negative.  Negative for appetite change and fatigue.  HENT: Negative.   Eyes: Negative.  Negative for pain and visual disturbance.  Respiratory: Negative.  Negative for cough, chest tightness, shortness of breath and wheezing.   Cardiovascular: Negative.  Negative for chest pain, palpitations and leg  swelling.  Gastrointestinal: Negative.  Negative for abdominal pain, diarrhea, nausea and vomiting.  Endocrine: Negative.   Genitourinary: Positive for flank pain. Negative for difficulty urinating, discharge, dysuria, frequency, hematuria and urgency.  Skin: Negative.  Negative for color change and rash.  Neurological: Negative.  Negative for weakness, numbness and headaches.  Psychiatric/Behavioral: Negative.       Medication List       Accurate as of 06/29/16 10:36 PM. Always use your most recent med list.          ALPRAZolam 0.5 MG tablet Commonly known as:  XANAX Take 1 tablet (0.5 mg total) by mouth at bedtime.   aspirin EC 81 MG tablet Take 81 mg by mouth daily.   testosterone cypionate 200 MG/ML injection Commonly known as:  DEPOTESTOSTERONE CYPIONATE Inject 1 mL (200 mg total) into the muscle every 14 (fourteen) days.          Objective:    BP (!) 155/86   Pulse 98   Temp 97.4 F (36.3 C) (Oral)   Ht _1  (1.753 m)   Wt 264 lb 3.2 oz (119.8 kg)   BMI 39.02 kg/m   No Known Allergies  Physical Exam  Constitutional: He appears well-developed and well-nourished.  HENT:  Head: Normocephalic and atraumatic.  Eyes: Conjunctivae and EOM are normal. Pupils are equal, round, and reactive to light.  Neck: Normal range of  motion. Neck supple.  Cardiovascular: Normal rate, regular rhythm and normal heart sounds.   Pulmonary/Chest: Effort normal and breath sounds normal.  Abdominal: Soft. Bowel sounds are normal.  Musculoskeletal: Normal range of motion.  Skin: Skin is warm and dry.    Results for orders placed or performed in visit on 06/29/16  Microscopic Examination  Result Value Ref Range   WBC, UA None seen 0 - 5 /hpf   RBC, UA 0-2 0 - 2 /hpf   Epithelial Cells (non renal) 0-10 0 - 10 /hpf   Renal Epithel, UA None seen None seen /hpf   Bacteria, UA None seen None seen/Few  Urinalysis, Complete  Result Value Ref Range   Specific Gravity, UA 1.005  1.005 - 1.030   pH, UA 7.0 5.0 - 7.5   Color, UA Yellow Yellow   Appearance Ur Clear Clear   Leukocytes, UA Negative Negative   Protein, UA Negative Negative/Trace   Glucose, UA Negative Negative   Ketones, UA Negative Negative   RBC, UA 2+ (A) Negative   Bilirubin, UA Negative Negative   Urobilinogen, Ur 0.2 0.2 - 1.0 mg/dL   Nitrite, UA Negative Negative   Microscopic Examination See below:       Assessment & Plan:   1. Hematuria, unspecified type - Urinalysis, Complete - CMP14+EGFR - Microalbumin / creatinine urine ratio  2. Flank pain - CMP14+EGFR - Microalbumin / creatinine urine ratio   Continue all other maintenance medications as listed above.  Follow up plan: Return if symptoms worsen or fail to improve, for [facility] record release 15 years ALL records.  Orders Placed This Encounter  Procedures  . Microscopic Examination  . Urinalysis, Complete  . CMP14+EGFR  . Microalbumin / creatinine urine ratio    Educational handout given for hematuria  Terald Sleeper PA-C [facility] [address]  [place], [place] [address] [phone]   06/29/2016, 10:36 PM

## 2016-06-30 LAB — CMP14+EGFR
ALT: 36 IU/L (ref 0–44)
AST: 22 IU/L (ref 0–40)
Albumin/Globulin Ratio: 1.5 (ref 1.2–2.2)
Albumin: 4.4 g/dL (ref 3.5–5.5)
Alkaline Phosphatase: 52 IU/L (ref 39–117)
BUN/Creatinine Ratio: 10 (ref 9–20)
BUN: 12 mg/dL (ref 6–24)
Bilirubin Total: 0.4 mg/dL (ref 0.0–1.2)
CO2: 26 mmol/L (ref 18–29)
Calcium: 9.9 mg/dL (ref 8.7–10.2)
Chloride: 97 mmol/L (ref 96–106)
Creatinine, Ser: 1.21 mg/dL (ref 0.76–1.27)
GFR calc Af Amer: 82 mL/min/{1.73_m2} (ref >59)
GFR calc non Af Amer: 71 mL/min/{1.73_m2} (ref >59)
Globulin, Total: 3 g/dL (ref 1.5–4.5)
Glucose: 128 mg/dL — ABNORMAL HIGH (ref 65–99)
Potassium: 4.4 mmol/L (ref 3.5–5.2)
Sodium: 138 mmol/L (ref 134–144)
Total Protein: 7.4 g/dL (ref 6.0–8.5)

## 2016-06-30 LAB — MICROALBUMIN / CREATININE URINE RATIO
Creatinine, Urine: 191.7 mg/dL
Microalb/Creat Ratio: 14.6 mg/g creat (ref 0.0–30.0)
Microalbumin, Urine: 28 ug/mL

## 2016-07-06 ENCOUNTER — Telehealth: Payer: Self-pay | Admitting: Physician Assistant

## 2016-07-29 NOTE — Telephone Encounter (Signed)
Patient aware of results.

## 2017-07-29 ENCOUNTER — Ambulatory Visit (INDEPENDENT_AMBULATORY_CARE_PROVIDER_SITE_OTHER): Payer: BLUE CROSS/BLUE SHIELD

## 2017-07-29 DIAGNOSIS — Z23 Encounter for immunization: Secondary | ICD-10-CM

## 2017-11-21 DIAGNOSIS — G4733 Obstructive sleep apnea (adult) (pediatric): Secondary | ICD-10-CM | POA: Diagnosis not present

## 2018-10-05 ENCOUNTER — Ambulatory Visit: Payer: BLUE CROSS/BLUE SHIELD | Admitting: Family Medicine

## 2018-10-05 ENCOUNTER — Encounter: Payer: Self-pay | Admitting: Family Medicine

## 2018-10-05 VITALS — BP 165/83 | HR 98 | Temp 96.9°F | Ht 69.0 in | Wt 259.2 lb

## 2018-10-05 DIAGNOSIS — J0100 Acute maxillary sinusitis, unspecified: Secondary | ICD-10-CM

## 2018-10-05 LAB — RAPID STREP SCREEN (MED CTR MEBANE ONLY): Strep Gp A Ag, IA W/Reflex: NEGATIVE

## 2018-10-05 LAB — VERITOR FLU A/B WAIVED
Influenza A: NEGATIVE
Influenza B: NEGATIVE

## 2018-10-05 LAB — CULTURE, GROUP A STREP

## 2018-10-05 MED ORDER — AZITHROMYCIN 250 MG PO TABS: ORAL_TABLET | ORAL | 0 refills | Status: DC

## 2018-10-05 NOTE — Progress Notes (Signed)
BP (!) 165/83   Pulse 98   Temp (!) 96.9 F (36.1 C) (Oral)   Ht 5\' 9"  (1.753 m)   Wt 259 lb 3.2 oz (117.6 kg)   SpO2 100%   BMI 38.28 kg/m    Subjective:    Patient ID: Shawn Mosley, male    DOB: 07-31-1971, 48 y.o.   MRN: [id number]  HPI: Shawn Mosley is a 48 y.o. male presenting on 10/05/2018 for Sore Throat (x 1 week on and off); Chills; Fatigue; Cough; and Nasal Congestion   HPI Sore throat and congestion and sinus pressure Patient comes in complaining of sore throat and congestion and sinus pressure this been going on for the past week on and off.  He has been using some Tylenol Sinus and cold medication to help with this with some success and it did help and improve when he thought it was getting better but then got worse over the weekend again.  He feels like he is back in the same spot is where he started.  Patient denies any fevers but has had chills.  He denies any shortness of breath or wheezing or body aches.  He says he gets like this every now and then about once every couple years and he gets a Z-Pak and it usually takes it.  Relevant past medical, surgical, family and social history reviewed and updated as indicated. Interim medical history since our last visit reviewed. Allergies and medications reviewed and updated.  Review of Systems  Constitutional: Positive for chills. Negative for fever.  HENT: Positive for congestion, postnasal drip, rhinorrhea, sinus pressure and sore throat. Negative for ear discharge, ear pain, sneezing and voice change.   Eyes: Negative for pain, discharge, redness and visual disturbance.  Respiratory: Positive for cough. Negative for shortness of breath and wheezing.   Cardiovascular: Negative for chest pain and leg swelling.  Musculoskeletal: Negative for gait problem.  Skin: Negative for rash.  All other systems reviewed and are negative.   Per HPI unless specifically indicated above   Allergies as of 10/05/2018   No Known  Allergies     Medication List       Accurate as of October 05, 2018  3:35 PM. Always use your most recent med list.        aspirin EC 81 MG tablet Take 81 mg by mouth daily.   azithromycin 250 MG tablet Commonly known as:  ZITHROMAX Take 2 the first day and then one each day after.          Objective:    BP (!) 165/83   Pulse 98   Temp (!) 96.9 F (36.1 C) (Oral)   Ht 5\' 9"  (1.753 m)   Wt 259 lb 3.2 oz (117.6 kg)   SpO2 100%   BMI 38.28 kg/m   Wt Readings from Last 3 Encounters:  10/05/18 259 lb 3.2 oz (117.6 kg)  06/29/16 264 lb 3.2 oz (119.8 kg)  04/15/16 257 lb 6.4 oz (116.8 kg)    Physical Exam Vitals signs and nursing note reviewed.  Constitutional:      General: He is not in acute distress.    Appearance: He is well-developed. He is not diaphoretic.  HENT:     Right Ear: Tympanic membrane, ear canal and external ear normal.     Left Ear: Tympanic membrane, ear canal and external ear normal.     Nose: Mucosal edema and rhinorrhea present.     Right Sinus: Maxillary  sinus tenderness present. No frontal sinus tenderness.     Left Sinus: Maxillary sinus tenderness present. No frontal sinus tenderness.     Mouth/Throat:     Pharynx: Uvula midline. No oropharyngeal exudate or posterior oropharyngeal erythema.     Tonsils: No tonsillar abscesses.  Eyes:     General: No scleral icterus.       Right eye: No discharge.     Conjunctiva/sclera: Conjunctivae normal.     Pupils: Pupils are equal, round, and reactive to light.  Neck:     Musculoskeletal: Neck supple.     Thyroid: No thyromegaly.  Cardiovascular:     Rate and Rhythm: Normal rate and regular rhythm.     Heart sounds: Normal heart sounds. No murmur.  Pulmonary:     Effort: Pulmonary effort is normal. No respiratory distress.     Breath sounds: Normal breath sounds. No wheezing or rales.  Musculoskeletal: Normal range of motion.  Lymphadenopathy:     Cervical: No cervical adenopathy.  Skin:     General: Skin is warm and dry.     Findings: No rash.  Neurological:     Mental Status: He is alert and oriented to person, place, and time.     Coordination: Coordination normal.  Psychiatric:        Behavior: Behavior normal.    Rapid strep and flu negative    Assessment & Plan:   Problem List Items Addressed This Visit    None    Visit Diagnoses    Acute non-recurrent maxillary sinusitis    -  Primary   Relevant Medications   azithromycin (ZITHROMAX) 250 MG tablet   Other Relevant Orders   Rapid Strep Screen ([facility] ONLY)   Veritor Flu A/B Waived       Follow up plan: Return if symptoms worsen or fail to improve.  Counseling provided for all of the vaccine components Orders Placed This Encounter  Procedures  . Rapid Strep Screen ([facility] ONLY)  . Veritor Flu A/B Waived    Arville Care, MD [facility] 10/05/2018, 3:35 PM

## 2018-12-28 ENCOUNTER — Telehealth: Payer: Self-pay | Admitting: Physician Assistant

## 2018-12-28 ENCOUNTER — Encounter (INDEPENDENT_AMBULATORY_CARE_PROVIDER_SITE_OTHER): Payer: Self-pay

## 2019-08-18 DIAGNOSIS — Z23 Encounter for immunization: Secondary | ICD-10-CM | POA: Diagnosis not present

## 2019-09-15 DIAGNOSIS — Z23 Encounter for immunization: Secondary | ICD-10-CM | POA: Diagnosis not present

## 2019-10-10 ENCOUNTER — Emergency Department (HOSPITAL_COMMUNITY): Payer: BC Managed Care – PPO

## 2019-10-10 ENCOUNTER — Other Ambulatory Visit: Payer: Self-pay

## 2019-10-10 ENCOUNTER — Emergency Department (HOSPITAL_COMMUNITY)
Admission: EM | Admit: 2019-10-10 | Discharge: 2019-10-10 | Disposition: A | Discharge status: Home / Self Care | Payer: BC Managed Care – PPO | Attending: Emergency Medicine | Admitting: Emergency Medicine

## 2019-10-10 ENCOUNTER — Encounter (HOSPITAL_COMMUNITY): Payer: Self-pay | Admitting: Emergency Medicine

## 2019-10-10 DIAGNOSIS — Z7982 Long term (current) use of aspirin: Secondary | ICD-10-CM | POA: Insufficient documentation

## 2019-10-10 DIAGNOSIS — M25512 Pain in left shoulder: Secondary | ICD-10-CM | POA: Diagnosis not present

## 2019-10-10 DIAGNOSIS — Z6839 Body mass index (BMI) 39.0-39.9, adult: Secondary | ICD-10-CM | POA: Insufficient documentation

## 2019-10-10 DIAGNOSIS — E669 Obesity, unspecified: Secondary | ICD-10-CM | POA: Diagnosis not present

## 2019-10-10 DIAGNOSIS — R0789 Other chest pain: Secondary | ICD-10-CM | POA: Diagnosis not present

## 2019-10-10 DIAGNOSIS — Z86711 Personal history of pulmonary embolism: Secondary | ICD-10-CM | POA: Insufficient documentation

## 2019-10-10 DIAGNOSIS — R079 Chest pain, unspecified: Secondary | ICD-10-CM | POA: Diagnosis not present

## 2019-10-10 LAB — CBC
HCT: 47.6 % (ref 39.0–52.0)
Hemoglobin: 15.6 g/dL (ref 13.0–17.0)
MCH: 29.4 pg (ref 26.0–34.0)
MCHC: 32.8 g/dL (ref 30.0–36.0)
MCV: 89.8 fL (ref 80.0–100.0)
Platelets: 481 10*3/uL — ABNORMAL HIGH (ref 150–400)
RBC: 5.3 MIL/uL (ref 4.22–5.81)
RDW: 13 % (ref 11.5–15.5)
WBC: 13.3 10*3/uL — ABNORMAL HIGH (ref 4.0–10.5)
nRBC: 0 % (ref 0.0–0.2)

## 2019-10-10 LAB — BASIC METABOLIC PANEL
Anion gap: 12 (ref 5–15)
BUN: 13 mg/dL (ref 6–20)
CO2: 24 mmol/L (ref 22–32)
Calcium: 9.9 mg/dL (ref 8.9–10.3)
Chloride: 96 mmol/L — ABNORMAL LOW (ref 98–111)
Creatinine, Ser: 1.29 mg/dL — ABNORMAL HIGH (ref 0.61–1.24)
GFR calc Af Amer: >60 mL/min (ref >60)
GFR calc non Af Amer: >60 mL/min (ref >60)
Glucose, Bld: 113 mg/dL — ABNORMAL HIGH (ref 70–99)
Potassium: 4 mmol/L (ref 3.5–5.1)
Sodium: 132 mmol/L — ABNORMAL LOW (ref 135–145)

## 2019-10-10 LAB — TROPONIN I (HIGH SENSITIVITY)
Troponin I (High Sensitivity): 4 ng/L (ref <18)
Troponin I (High Sensitivity): 6 ng/L (ref <18)

## 2019-10-10 LAB — D-DIMER, QUANTITATIVE (NOT AT ARMC): D-Dimer, Quant: <0.27 ug/mL-FEU (ref 0.00–0.50)

## 2019-10-10 MED ORDER — DICLOFENAC SODIUM 1 % EX GEL: 2.0000 g | Freq: Four times a day (QID) | CUTANEOUS | 0 refills | Status: AC

## 2019-10-10 MED ORDER — SODIUM CHLORIDE 0.9% FLUSH: 3.0000 mL | Freq: Once | INTRAVENOUS | Status: DC

## 2019-10-10 MED ORDER — LIDOCAINE 5 % EX PTCH
1.0000 | MEDICATED_PATCH | CUTANEOUS | Status: DC
  Administered 2019-10-10: 1 via TRANSDERMAL
  Filled 2019-10-10: qty 1

## 2019-10-10 NOTE — ED Triage Notes (Signed)
Pt arrives to ED from home with complaints of left sided chest pain and left shoulder pain x1 week. Patient describes the pain as a sprained muscle or a like a spasm. Patient denies SOB, back pain, or any other issues today.

## 2019-10-10 NOTE — ED Provider Notes (Signed)
[facility] EMERGENCY DEPARTMENT Provider Note   CSN: [id number] Arrival date & time: 10/10/19  1304     History Chief Complaint  Patient presents with  . Chest Pain    Shawn Mosley is a 49 y.o. male.  49 year old male with past medical history of sleep apnea, DVT/PE, obesity presents with complaint of pain in his left upper chest/left shoulder area x1 week.  Patient states pain is intermittent, catching/tight/spasming in nature, lasts for a few seconds and resolves.  Nothing makes his pain better or worse, pain is not reproduced with exertion or with palpation or movement of the shoulder.  Denies associated nausea, vomiting, shortness of breath, diaphoresis, lower extremity edema.  No history of diabetes, hypertension, hyperlipidemia, is a non-smoker, patient is not on anticoagulants.  No other complaints or concerns.        Past Medical History:  Diagnosis Date  . Diverticulitis 2012  . 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 Obesity (BMI 30-39.9)   . PE (pulmonary thromboembolism) ([facility]) 2012-2013   Resolved with warfarin x1 year  . Sleep apnea    Cpap    Patient Active Problem List   Diagnosis Date Noted  . Sleep apnea, obstructive 04/15/2016  . Hypotestosteronemia 04/15/2016  . Status post bladder repair 10/13/2012  . Obesity (BMI 30-39.9)   . Diverticulitis of large intestine with perforation 02/02/2011    Past Surgical History:  Procedure Laterality Date  . BLADDER REPAIR  10/12/2012  . COLONOSCOPY  [id number]  . LAPAROSCOPIC LOW ANTERIOR RESECTION  10/12/2012  . LAPAROSCOPIC LOW ANTERIOR RESECTION N/A 10/12/2012   Procedure: LAPAROSCOPIC LOW ANTERIOR RESECTION;  Surgeon: Ardeth Sportsman, MD;  Location: [facility];  Service: General;  Laterality: N/A;  bladder repair  . LAPAROSCOPY ABDOMEN DIAGNOSTIC  12/10/2010   Washout of diverticular abscess  . PROCTOSCOPY N/A 10/12/2012   Procedure: PROCTOSCOPY;  Surgeon: Ardeth Sportsman, MD;  Location: [facility];  Service: General;  Laterality: N/A;       Family History  Problem Relation Age of Onset  . Liver cancer Father   . Colon cancer Neg Hx     Social History   Tobacco Use  . Smoking status: Never Smoker  . Smokeless tobacco: Never Used  Substance Use Topics  . Alcohol use: Yes    Comment: occasional  . Drug use: No    Home Medications Prior to Admission medications   Medication Sig Start Date End Date Taking? Authorizing Provider  aspirin EC 81 MG tablet Take 81 mg by mouth daily.    [provider]  azithromycin (ZITHROMAX) 250 MG tablet Take 2 the first day and then one each day after. 10/05/18   Dettinger, Elige Radon, MD    Allergies    Patient has no known allergies.  Review of Systems   Review of Systems  Constitutional: Negative for diaphoresis and fever.  Respiratory: Negative for shortness of breath.   Cardiovascular: Positive for chest pain.  Gastrointestinal: Negative for nausea and vomiting.  Musculoskeletal: Negative for arthralgias and myalgias.  Skin: Negative for rash and wound.  Allergic/Immunologic: Negative for immunocompromised state.  Neurological: Negative for weakness and numbness.  Hematological: Negative for adenopathy.  Psychiatric/Behavioral: Negative for confusion.  All other systems reviewed and are negative.   Physical Exam Updated Vital Signs BP (!) 159/109 (BP Location: Right Arm)   Pulse (!) 112   Temp 98 F (36.7 C) (Oral)   Resp  14   Wt 120.2 kg   SpO2 100%   BMI 39.13 kg/m   Physical Exam Vitals and nursing note reviewed.  Constitutional:      General: He is not in acute distress.    Appearance: He is well-developed. He is obese. He is not diaphoretic.  HENT:     Head: Normocephalic and atraumatic.  Cardiovascular:     Rate and Rhythm: Regular rhythm. Tachycardia present.     Heart sounds: Normal heart sounds.  Pulmonary:     Effort: Pulmonary effort is normal.     Breath  sounds: Normal breath sounds.  Chest:     Chest wall: No tenderness or crepitus.  Musculoskeletal:     Right lower leg: No edema.     Left lower leg: No edema.  Skin:    General: Skin is warm and dry.     Findings: No erythema or rash.  Neurological:     Mental Status: He is alert and oriented to person, place, and time.  Psychiatric:        Behavior: Behavior normal.     ED Results / Procedures / Treatments   Labs (all labs ordered are listed, but only abnormal results are displayed) Labs Reviewed  BASIC METABOLIC PANEL - Abnormal; Notable for the following components:      Result Value   Sodium 132 (*)    Chloride 96 (*)    Glucose, Bld 113 (*)    Creatinine, Ser 1.29 (*)    All other components within normal limits  CBC - Abnormal; Notable for the following components:   WBC 13.3 (*)    Platelets 481 (*)    All other components within normal limits  D-DIMER, QUANTITATIVE (NOT AT [facility])  TROPONIN I (HIGH SENSITIVITY)  TROPONIN I (HIGH SENSITIVITY)    EKG None  Radiology DG Chest 2 View  Result Date: 10/10/2019 CLINICAL DATA:  Chest pain. Additional history provided: Left anterior chest pain which began this morning. EXAM: CHEST - 2 VIEW COMPARISON:  No pertinent prior studies available for comparison. FINDINGS: Shallow inspiration radiograph. Heart size within normal limits. Mild left basilar atelectasis. The lungs are otherwise clear. No evidence of pleural effusion or pneumothorax. No acute bony abnormality. IMPRESSION: Mild left basilar atelectasis. Otherwise, no evidence of acute cardiopulmonary abnormality. Electronically Signed   By: Jackey Loge DO   On: 10/10/2019 14:32    Procedures Procedures (including critical care time)  Medications Ordered in ED Medications  sodium chloride flush (NS) 0.9 % injection 3 mL (has no administration in time range)    ED Course  I have reviewed the triage vital signs and the nursing notes.  Pertinent labs & imaging  results that were available during my care of the patient were reviewed by me and considered in my medical decision making (see chart for details).  Clinical Course as of Oct 10 1919  Wed Oct 10, 2019  [id number] 49 year old male with pain in his left shoulder/left pectoral area for the past week consistent with ACS.  Patient was found to be tachycardic in the ER with history of PE, not anticoagulated, a D-dimer was ordered and is negative.  Troponin x2 -, remaining labs CBC and BMP.  CBC with mild leukocytosis of 13,000, no infectious symptoms today.  EKG shows no acute ischemic changes.  Chest x-ray with question of left basilar atelectasis. Patient will be given a Lidoderm patch today, given prescription for Voltaren gel with plan to follow-up with his primary  care provider, return to ER for new or worsening symptoms.   [LM]    Clinical Course User Index [LM] Roque Lias   MDM Rules/Calculators/A&P                      Final Clinical Impression(s) / ED Diagnoses Final diagnoses:  None    Rx / DC Orders ED Discharge Orders    None       Roque Lias 10/10/19 1921    Drenda Freeze, MD 10/13/19 [phone]

## 2019-10-10 NOTE — Discharge Instructions (Addendum)
You can apply over-the-counter lidocaine patches to the area or the Voltaren gel as prescribed today. Plan to follow-up with your primary care provider for recheck and further work-up, return to ER for new or worsening symptoms.

## 2021-02-27 DIAGNOSIS — M7918 Myalgia, other site: Secondary | ICD-10-CM | POA: Diagnosis not present

## 2021-02-27 DIAGNOSIS — S76011A Strain of muscle, fascia and tendon of right hip, initial encounter: Secondary | ICD-10-CM | POA: Diagnosis not present

## 2022-11-12 ENCOUNTER — Encounter: Payer: Self-pay | Admitting: Gastroenterology
